# Patient Record
Sex: Female | Born: 1935 | Race: White | Hispanic: No | Smoking: Never smoker
Health system: Southern US, Community
[De-identification: ages and names within clinical notes are randomized; demographics above are authoritative.]

## PROBLEM LIST (undated history)

## (undated) DIAGNOSIS — I2510 Atherosclerotic heart disease of native coronary artery without angina pectoris: Secondary | ICD-10-CM

## (undated) DIAGNOSIS — D649 Anemia, unspecified: Secondary | ICD-10-CM

## (undated) DIAGNOSIS — R7309 Other abnormal glucose: Secondary | ICD-10-CM

## (undated) DIAGNOSIS — I6529 Occlusion and stenosis of unspecified carotid artery: Secondary | ICD-10-CM

## (undated) DIAGNOSIS — I10 Essential (primary) hypertension: Secondary | ICD-10-CM

## (undated) DIAGNOSIS — I5033 Acute on chronic diastolic (congestive) heart failure: Secondary | ICD-10-CM

## (undated) DIAGNOSIS — R0602 Shortness of breath: Secondary | ICD-10-CM

## (undated) DIAGNOSIS — C801 Malignant (primary) neoplasm, unspecified: Secondary | ICD-10-CM

## (undated) DIAGNOSIS — I219 Acute myocardial infarction, unspecified: Secondary | ICD-10-CM

## (undated) DIAGNOSIS — M1990 Unspecified osteoarthritis, unspecified site: Secondary | ICD-10-CM

## (undated) DIAGNOSIS — E785 Hyperlipidemia, unspecified: Secondary | ICD-10-CM

## (undated) DIAGNOSIS — H3530 Unspecified macular degeneration: Secondary | ICD-10-CM

## (undated) HISTORY — PX: OTHER SURGICAL HISTORY: SHX169

## (undated) HISTORY — DX: Hyperlipidemia, unspecified: E78.5

## (undated) HISTORY — PX: CATARACT EXTRACTION: SUR2

## (undated) HISTORY — DX: Anemia, unspecified: D64.9

## (undated) HISTORY — DX: Unspecified osteoarthritis, unspecified site: M19.90

## (undated) HISTORY — DX: Acute on chronic diastolic (congestive) heart failure: I50.33

## (undated) HISTORY — DX: Unspecified macular degeneration: H35.30

## (undated) HISTORY — DX: Essential (primary) hypertension: I10

## (undated) HISTORY — DX: Other abnormal glucose: R73.09

## (undated) HISTORY — PX: ADENOIDECTOMY: SUR15

## (undated) HISTORY — PX: TONSILLECTOMY: SUR1361

## (undated) HISTORY — DX: Malignant (primary) neoplasm, unspecified: C80.1

## (undated) HISTORY — PX: EYE SURGERY: SHX253

## (undated) HISTORY — DX: Occlusion and stenosis of unspecified carotid artery: I65.29

## (undated) HISTORY — DX: Atherosclerotic heart disease of native coronary artery without angina pectoris: I25.10

---

## 1999-08-17 HISTORY — PX: OTHER SURGICAL HISTORY: SHX169

## 1999-10-23 ENCOUNTER — Inpatient Hospital Stay (HOSPITAL_COMMUNITY): Admission: EM | Admit: 1999-10-23 | Discharge: 1999-10-30 | Payer: Self-pay | Admitting: Emergency Medicine

## 1999-10-23 ENCOUNTER — Encounter: Payer: Self-pay | Admitting: Emergency Medicine

## 1999-10-26 ENCOUNTER — Encounter: Payer: Self-pay | Admitting: Internal Medicine

## 2001-08-16 DIAGNOSIS — R7309 Other abnormal glucose: Secondary | ICD-10-CM

## 2001-08-16 HISTORY — DX: Other abnormal glucose: R73.09

## 2002-05-28 ENCOUNTER — Observation Stay (HOSPITAL_COMMUNITY): Admission: EM | Admit: 2002-05-28 | Discharge: 2002-05-29 | Payer: Self-pay | Admitting: Emergency Medicine

## 2002-05-28 ENCOUNTER — Encounter: Payer: Self-pay | Admitting: Emergency Medicine

## 2002-05-29 ENCOUNTER — Encounter: Payer: Self-pay | Admitting: Cardiology

## 2004-09-25 ENCOUNTER — Ambulatory Visit: Payer: Self-pay | Admitting: Cardiology

## 2004-10-21 ENCOUNTER — Ambulatory Visit: Payer: Self-pay | Admitting: Internal Medicine

## 2005-11-29 ENCOUNTER — Ambulatory Visit: Payer: Self-pay | Admitting: Cardiology

## 2005-12-14 ENCOUNTER — Ambulatory Visit: Payer: Self-pay

## 2006-12-06 ENCOUNTER — Ambulatory Visit: Payer: Self-pay

## 2006-12-06 ENCOUNTER — Ambulatory Visit: Payer: Self-pay | Admitting: Cardiology

## 2006-12-06 ENCOUNTER — Encounter: Payer: Self-pay | Admitting: Internal Medicine

## 2007-01-03 ENCOUNTER — Ambulatory Visit: Payer: Self-pay | Admitting: Internal Medicine

## 2007-01-03 ENCOUNTER — Encounter: Payer: Self-pay | Admitting: Internal Medicine

## 2007-01-06 ENCOUNTER — Encounter: Payer: Self-pay | Admitting: Internal Medicine

## 2007-01-11 ENCOUNTER — Ambulatory Visit (HOSPITAL_COMMUNITY): Admission: RE | Admit: 2007-01-11 | Discharge: 2007-01-11 | Payer: Self-pay | Admitting: Ophthalmology

## 2007-01-24 ENCOUNTER — Telehealth (INDEPENDENT_AMBULATORY_CARE_PROVIDER_SITE_OTHER): Payer: Self-pay | Admitting: *Deleted

## 2007-03-03 ENCOUNTER — Encounter: Payer: Self-pay | Admitting: Internal Medicine

## 2007-03-03 ENCOUNTER — Ambulatory Visit: Payer: Self-pay | Admitting: Internal Medicine

## 2007-03-03 DIAGNOSIS — I10 Essential (primary) hypertension: Secondary | ICD-10-CM | POA: Insufficient documentation

## 2007-03-03 DIAGNOSIS — E739 Lactose intolerance, unspecified: Secondary | ICD-10-CM | POA: Insufficient documentation

## 2007-03-03 DIAGNOSIS — I2510 Atherosclerotic heart disease of native coronary artery without angina pectoris: Secondary | ICD-10-CM | POA: Insufficient documentation

## 2007-03-03 LAB — CONVERTED CEMR LAB
Glucose, Bld: 117 mg/dL
Hemoglobin: 13.2 g/dL

## 2007-04-12 ENCOUNTER — Encounter: Payer: Self-pay | Admitting: Internal Medicine

## 2007-06-21 ENCOUNTER — Ambulatory Visit: Payer: Self-pay

## 2007-11-30 ENCOUNTER — Ambulatory Visit: Payer: Self-pay | Admitting: Cardiovascular Disease

## 2007-12-28 ENCOUNTER — Telehealth (INDEPENDENT_AMBULATORY_CARE_PROVIDER_SITE_OTHER): Payer: Self-pay | Admitting: *Deleted

## 2007-12-29 ENCOUNTER — Ambulatory Visit: Payer: Self-pay | Admitting: Internal Medicine

## 2008-01-15 ENCOUNTER — Ambulatory Visit: Payer: Self-pay | Admitting: Internal Medicine

## 2008-01-15 LAB — CONVERTED CEMR LAB
OCCULT 1: POSITIVE
OCCULT 2: NEGATIVE
OCCULT 3: NEGATIVE

## 2008-01-16 ENCOUNTER — Encounter: Payer: Self-pay | Admitting: Internal Medicine

## 2008-01-17 ENCOUNTER — Encounter (INDEPENDENT_AMBULATORY_CARE_PROVIDER_SITE_OTHER): Payer: Self-pay | Admitting: *Deleted

## 2008-02-07 ENCOUNTER — Ambulatory Visit: Payer: Self-pay | Admitting: Gastroenterology

## 2008-02-09 ENCOUNTER — Telehealth (INDEPENDENT_AMBULATORY_CARE_PROVIDER_SITE_OTHER): Payer: Self-pay | Admitting: *Deleted

## 2008-02-12 ENCOUNTER — Ambulatory Visit: Payer: Self-pay | Admitting: Internal Medicine

## 2008-02-15 ENCOUNTER — Encounter (INDEPENDENT_AMBULATORY_CARE_PROVIDER_SITE_OTHER): Payer: Self-pay | Admitting: *Deleted

## 2008-02-15 LAB — CONVERTED CEMR LAB
BUN: 11 mg/dL (ref 6–23)
Creatinine, Ser: 0.8 mg/dL (ref 0.4–1.2)
Potassium: 4.5 meq/L (ref 3.5–5.1)

## 2008-07-10 ENCOUNTER — Ambulatory Visit: Payer: Self-pay

## 2008-10-03 ENCOUNTER — Ambulatory Visit: Payer: Self-pay | Admitting: Internal Medicine

## 2008-10-03 LAB — CONVERTED CEMR LAB
ALT: 19 units/L (ref 0–35)
Albumin: 3.9 g/dL (ref 3.5–5.2)
Amylase: 104 units/L (ref 27–131)
Basophils Absolute: 0 10*3/uL (ref 0.0–0.1)
Basophils Relative: 0.2 % (ref 0.0–3.0)
Bilirubin, Direct: 0.1 mg/dL (ref 0.0–0.3)
Eosinophils Relative: 0.8 % (ref 0.0–5.0)
Lipase: 36 units/L (ref 11.0–59.0)
Lymphocytes Relative: 17.4 % (ref 12.0–46.0)
MCV: 89.5 fL (ref 78.0–100.0)
Neutrophils Relative %: 72.6 % (ref 43.0–77.0)
RDW: 12.4 % (ref 11.5–14.6)

## 2008-10-04 ENCOUNTER — Encounter (INDEPENDENT_AMBULATORY_CARE_PROVIDER_SITE_OTHER): Payer: Self-pay | Admitting: *Deleted

## 2008-10-09 ENCOUNTER — Ambulatory Visit: Payer: Self-pay | Admitting: Internal Medicine

## 2008-10-09 LAB — CONVERTED CEMR LAB
OCCULT 1: NEGATIVE
OCCULT 2: NEGATIVE
OCCULT 3: NEGATIVE

## 2008-10-10 ENCOUNTER — Encounter (INDEPENDENT_AMBULATORY_CARE_PROVIDER_SITE_OTHER): Payer: Self-pay | Admitting: *Deleted

## 2008-11-12 DIAGNOSIS — Z8669 Personal history of other diseases of the nervous system and sense organs: Secondary | ICD-10-CM | POA: Insufficient documentation

## 2008-11-12 DIAGNOSIS — M1990 Unspecified osteoarthritis, unspecified site: Secondary | ICD-10-CM | POA: Insufficient documentation

## 2008-11-12 DIAGNOSIS — I6529 Occlusion and stenosis of unspecified carotid artery: Secondary | ICD-10-CM

## 2008-11-12 DIAGNOSIS — H9190 Unspecified hearing loss, unspecified ear: Secondary | ICD-10-CM | POA: Insufficient documentation

## 2008-11-12 DIAGNOSIS — E785 Hyperlipidemia, unspecified: Secondary | ICD-10-CM

## 2008-11-12 DIAGNOSIS — Z87898 Personal history of other specified conditions: Secondary | ICD-10-CM

## 2008-11-12 DIAGNOSIS — C4490 Unspecified malignant neoplasm of skin, unspecified: Secondary | ICD-10-CM

## 2008-11-19 ENCOUNTER — Ambulatory Visit: Payer: Self-pay | Admitting: Cardiovascular Disease

## 2008-11-19 ENCOUNTER — Encounter: Payer: Self-pay | Admitting: Cardiovascular Disease

## 2009-07-16 ENCOUNTER — Encounter: Payer: Self-pay | Admitting: Cardiovascular Disease

## 2009-07-17 ENCOUNTER — Ambulatory Visit: Payer: Self-pay

## 2009-07-17 ENCOUNTER — Encounter: Payer: Self-pay | Admitting: Cardiology

## 2009-07-17 ENCOUNTER — Telehealth (INDEPENDENT_AMBULATORY_CARE_PROVIDER_SITE_OTHER): Payer: Self-pay | Admitting: *Deleted

## 2009-07-18 ENCOUNTER — Encounter: Payer: Self-pay | Admitting: Cardiovascular Disease

## 2009-11-18 ENCOUNTER — Ambulatory Visit: Payer: Self-pay | Admitting: Cardiovascular Disease

## 2009-11-21 LAB — CONVERTED CEMR LAB
BUN: 15 mg/dL (ref 6–23)
GFR calc non Af Amer: 62.03 mL/min (ref >60)
Glucose, Bld: 88 mg/dL (ref 70–99)

## 2009-12-01 ENCOUNTER — Telehealth: Payer: Self-pay | Admitting: Cardiovascular Disease

## 2009-12-26 ENCOUNTER — Ambulatory Visit: Payer: Self-pay | Admitting: Cardiology

## 2009-12-26 ENCOUNTER — Inpatient Hospital Stay (HOSPITAL_COMMUNITY): Admission: EM | Admit: 2009-12-26 | Discharge: 2009-12-30 | Payer: Self-pay | Admitting: Emergency Medicine

## 2009-12-27 ENCOUNTER — Encounter (INDEPENDENT_AMBULATORY_CARE_PROVIDER_SITE_OTHER): Payer: Self-pay | Admitting: Family Medicine

## 2009-12-29 ENCOUNTER — Ambulatory Visit: Payer: Self-pay | Admitting: Vascular Surgery

## 2009-12-29 ENCOUNTER — Encounter (INDEPENDENT_AMBULATORY_CARE_PROVIDER_SITE_OTHER): Payer: Self-pay | Admitting: Internal Medicine

## 2009-12-30 ENCOUNTER — Encounter (INDEPENDENT_AMBULATORY_CARE_PROVIDER_SITE_OTHER): Payer: Self-pay | Admitting: Internal Medicine

## 2009-12-31 ENCOUNTER — Encounter: Payer: Self-pay | Admitting: Internal Medicine

## 2010-01-08 ENCOUNTER — Telehealth: Payer: Self-pay | Admitting: Cardiovascular Disease

## 2010-01-16 ENCOUNTER — Ambulatory Visit: Payer: Self-pay | Admitting: Cardiovascular Disease

## 2010-02-24 ENCOUNTER — Telehealth (INDEPENDENT_AMBULATORY_CARE_PROVIDER_SITE_OTHER): Payer: Self-pay | Admitting: *Deleted

## 2010-08-27 ENCOUNTER — Ambulatory Visit
Admission: RE | Admit: 2010-08-27 | Discharge: 2010-08-27 | Payer: Self-pay | Source: Home / Self Care | Attending: Internal Medicine | Admitting: Internal Medicine

## 2010-08-27 DIAGNOSIS — R05 Cough: Secondary | ICD-10-CM | POA: Insufficient documentation

## 2010-08-28 ENCOUNTER — Ambulatory Visit
Admission: RE | Admit: 2010-08-28 | Discharge: 2010-08-28 | Payer: Self-pay | Source: Home / Self Care | Attending: Internal Medicine | Admitting: Internal Medicine

## 2010-08-28 ENCOUNTER — Other Ambulatory Visit: Payer: Self-pay | Admitting: Internal Medicine

## 2010-08-28 LAB — CBC WITH DIFFERENTIAL/PLATELET
Basophils Absolute: 0.1 10*3/uL (ref 0.0–0.1)
Basophils Relative: 0.6 % (ref 0.0–3.0)
Eosinophils Absolute: 0.1 10*3/uL (ref 0.0–0.7)
Eosinophils Relative: 0.9 % (ref 0.0–5.0)
HCT: 36.9 % (ref 36.0–46.0)
Hemoglobin: 12.4 g/dL (ref 12.0–15.0)
Lymphocytes Relative: 18.5 % (ref 12.0–46.0)
Lymphs Abs: 1.7 10*3/uL (ref 0.7–4.0)
MCHC: 33.7 g/dL (ref 30.0–36.0)
MCV: 85.8 fl (ref 78.0–100.0)
Monocytes Absolute: 0.8 10*3/uL (ref 0.1–1.0)
Monocytes Relative: 9.1 % (ref 3.0–12.0)
Neutro Abs: 6.4 10*3/uL (ref 1.4–7.7)
Neutrophils Relative %: 70.9 % (ref 43.0–77.0)
Platelets: 369 10*3/uL (ref 150.0–400.0)
RBC: 4.3 Mil/uL (ref 3.87–5.11)
RDW: 14.1 % (ref 11.5–14.6)
WBC: 9.1 10*3/uL (ref 4.5–10.5)

## 2010-08-28 LAB — BASIC METABOLIC PANEL
BUN: 21 mg/dL (ref 6–23)
CO2: 30 mEq/L (ref 19–32)
Calcium: 9.4 mg/dL (ref 8.4–10.5)
Chloride: 97 mEq/L (ref 96–112)
Creatinine, Ser: 1.1 mg/dL (ref 0.4–1.2)
GFR: 48.62 mL/min — ABNORMAL LOW (ref >60.00)
Glucose, Bld: 106 mg/dL — ABNORMAL HIGH (ref 70–99)
Potassium: 4.7 mEq/L (ref 3.5–5.1)
Sodium: 135 mEq/L (ref 135–145)

## 2010-08-28 LAB — HEPATIC FUNCTION PANEL
ALT: 15 U/L (ref 0–35)
AST: 20 U/L (ref 0–37)
Albumin: 3.8 g/dL (ref 3.5–5.2)
Alkaline Phosphatase: 75 U/L (ref 39–117)
Bilirubin, Direct: 0.1 mg/dL (ref 0.0–0.3)
Total Bilirubin: 0.7 mg/dL (ref 0.3–1.2)
Total Protein: 7.3 g/dL (ref 6.0–8.3)

## 2010-08-28 LAB — LIPID PANEL
Cholesterol: 201 mg/dL — ABNORMAL HIGH (ref 0–200)
HDL: 48.1 mg/dL (ref >39.00)
Total CHOL/HDL Ratio: 4
Triglycerides: 140 mg/dL (ref 0.0–149.0)
VLDL: 28 mg/dL (ref 0.0–40.0)

## 2010-08-28 LAB — TSH: TSH: 4.57 u[IU]/mL (ref 0.35–5.50)

## 2010-08-28 LAB — LDL CHOLESTEROL, DIRECT: Direct LDL: 133.5 mg/dL

## 2010-08-31 LAB — HEMOGLOBIN A1C: Hgb A1c MFr Bld: 6.7 % — ABNORMAL HIGH (ref 4.6–6.5)

## 2010-09-17 NOTE — Letter (Signed)
Summary: Encounter Notice/[facility]  Encounter Notice/[facility]   Imported By: Lanelle Bal 01/07/2010 09:03:08  _____________________________________________________________________  External Attachment:    Type:   Image     Comment:   External Document

## 2010-09-17 NOTE — Progress Notes (Signed)
Summary: Question about Plavix  Phone Note Other Incoming   Caller: [facility]/Melissa [phone] Summary of Call: Nurse calling regarding the pt Plavix have questions about pt taking this medication Initial call taken by: Judie Grieve,  Jan 08, 2010 9:32 AM  Follow-up for Phone Call        Resident health care coordinator at [facility]. Left message for Melissa to call back. Julieta Gutting, RN, BSN  Jan 08, 2010 10:08 AM  I spoke with Efraim Kaufmann and the pt was recently hospitalized for TIA symptoms.  The pt was not under our service.  Melissa noticed that Plavix was on the pt's discharge summary but the pt is not taking this medication.  The pt does list Plavix as an allergy due to stomach upset.  I made Melissa aware that the pt was refusing to take Plavix per her discharge summary.  The pt is currently taking ASA as instructed.  I told Melissa that the pt has the right to not take Plavix if that is her decision.  Follow-up by: Julieta Gutting, RN, BSN,  Jan 08, 2010 10:21 AM

## 2010-09-17 NOTE — Assessment & Plan Note (Signed)
Summary: roa per pt//lch   Vital Signs:  Patient profile:   77 year old female Height:      62.5 inches Weight:      123.8 pounds BMI:     22.36 Temp:     98.4 degrees F oral Pulse rate:   72 / minute Resp:     16 per minute BP sitting:   118 / 60  (left arm) Cuff size:   regular  Vitals Entered By: Shonna Chock CMA (August 27, 2010 9:30 AM) CC: Patient not seen in a while and would like to follow-up with Dr.Jarrell Armond , Cough   Primary Care Provider:  Marga Melnick, MD  CC:  Patient not seen in a while and would like to follow-up with Dr.Lindsey Hommel  and Cough.  History of Present Illness: Hypertension Follow-Up      This is a 77 year old woman who presents for Hypertension follow-up.  The patient reports urinary frequency ( "but I drink up to 40 oz of water / day"), but denies lightheadedness, headaches, edema, and fatigue.  The patient denies the following associated symptoms: exertional chest pain, chest pressure, dyspnea, palpitations, and syncope.  Compliance with medications (by patient report) has been near 100%.  The patient reports no exercise.  Adjunctive measures currently used by the patient include salt restriction. Dr Nash Mantis OV post hospitalization for TIA in context of L carotid stenosis  reviewed  Cough      The patient also presents with Cough , "? ever since I've been taking Diovan".  The patient reports non-productive cough, but denies pleuritic chest pain, wheezing, fever, and hemoptysis.  Associated symtpoms include chronic rhinitis. No PMH of asthma; she never smoked. The patient denies the following symptoms: cold/URI symptoms, nasal congestion, and acid reflux symptoms.  Ineffective prior treatments have included OTC cough medication.    Current Medications (verified): 1)  Diovan Hct 160-12.5 Mg Tabs (Valsartan-Hydrochlorothiazide) .... Take One Tablet Daily 2)  Lumigan 0.03 %  Soln (Bimatoprost) 3)  Nitrostat 0.4 Mg  Subl (Nitroglycerin) .... One Under The Tongue  Every Five Minutes For Three Times If Chest Pain 4)  Metoprolol Tartrate 50 Mg Tabs (Metoprolol Tartrate) .... Take 1/2 Tablet Two Times A Day 5)  Centrum Silver  Tabs (Multiple Vitamins-Minerals) .... Take 1 Tablet By Mouth Once A Day 6)  Fish Oil 1000 Mg Caps (Omega-3 Fatty Acids) .... Take 1 Capsule By Mouth Once A Day 7)  Vitamin C 500 Mg Chew (Ascorbic Acid) .... Once Daily 8)  Hydralazine Hcl 25 Mg Tabs (Hydralazine Hcl) .... Take 1 Tablet By Mouth Three Times A Day 9)  Aspirin 81 Mg Tbec (Aspirin) .... Take One Tablet By Mouth Daily 10)  Azopt 1 % Susp (Brinzolamide) .... Two Times A Day  Allergies: 1)  ! * Altace 2)  ! Lipitor (Atorvastatin) 3)  ! Plavix (Clopidogrel Bisulfate)  Review of Systems Allergy:  Complains of itching eyes and sneezing.  Physical Exam  General:  Thin,in no acute distress; alert,appropriate and cooperative throughout examination Eyes:  Ptosis OD Ears:  External ear exam shows no significant lesions or deformities.  Otoscopic examination reveals clear canals, tympanic membranes are intact bilaterally without bulging, retraction, inflammation or discharge. Hearing is grossly decreased (she is to have hearing aid checked) Nose:  External nasal examination shows no deformity or inflammation. Nasal mucosa are pink and moist without lesions or exudates. Mouth:  Oral mucosa and oropharynx without lesions or exudates.  Teeth in good repair. Lungs:  Normal respiratory effort, chest expands symmetrically. Lungs are clear to auscultation, no crackles or wheezes. Decreased BS Heart:  Normal rate and regular rhythm. S1 and S2 normal without gallop,  click, rub . Faint systolic murmur Pulses:  R and L carotid,radial,dorsalis pedis and posterior tibial pulses are full and equal bilaterally. Carotid bruits bilaterally Extremities:  No clubbing, cyanosis, edema. Neurologic:  alert & oriented X3.   Nasolabial asymmetry, decreased on L Skin:  Intact without suspicious  lesions or rashes Cervical Nodes:  No lymphadenopathy noted Axillary Nodes:  No palpable lymphadenopathy Psych:  memory intact for recent and remote, normally interactive, and good eye contact.     Impression & Recommendations:  Problem # 1:  COUGH (ICD-786.2)  Orders: Prescription Created Electronically [phone]) T-2 View CXR (71020TC)  Problem # 2:  HYPERTENSION (ICD-401.9) controlled Her updated medication list for this problem includes:    Diovan Hct 160-12.5 Mg Tabs (Valsartan-hydrochlorothiazide) .Marland Kitchen... Take one tablet daily    Metoprolol Tartrate 50 Mg Tabs (Metoprolol tartrate) .Marland Kitchen... Take 1/2 tablet two times a day    Hydralazine Hcl 25 Mg Tabs (Hydralazine hcl) .Marland Kitchen... Take 1 tablet by mouth three times a day  Problem # 3:  DYSLIPIDEMIA (ICD-272.4)  Problem # 4:  CAROTID STENOSIS (ICD-433.10)  Her updated medication list for this problem includes:    Aspirin 81 Mg Tbec (Aspirin) .Marland Kitchen... Take one tablet by mouth daily  Problem # 5:  CORONARY ARTERY DISEASE (ICD-414.00)  Her updated medication list for this problem includes:    Diovan Hct 160-12.5 Mg Tabs (Valsartan-hydrochlorothiazide) .Marland Kitchen... Take one tablet daily    Nitrostat 0.4 Mg Subl (Nitroglycerin) ..... One under the tongue every five minutes for three times if chest pain    Metoprolol Tartrate 50 Mg Tabs (Metoprolol tartrate) .Marland Kitchen... Take 1/2 tablet two times a day    Hydralazine Hcl 25 Mg Tabs (Hydralazine hcl) .Marland Kitchen... Take 1 tablet by mouth three times a day    Aspirin 81 Mg Tbec (Aspirin) .Marland Kitchen... Take one tablet by mouth daily  Orders: Prescription Created Electronically [phone])  Complete Medication List: 1)  Diovan Hct 160-12.5 Mg Tabs (Valsartan-hydrochlorothiazide) .... Take one tablet daily 2)  Lumigan 0.03 % Soln (Bimatoprost) 3)  Nitrostat 0.4 Mg Subl (Nitroglycerin) .... One under the tongue every five minutes for three times if chest pain 4)  Metoprolol Tartrate 50 Mg Tabs (Metoprolol tartrate) .... Take 1/2  tablet two times a day 5)  Centrum Silver Tabs (Multiple vitamins-minerals) .... Take 1 tablet by mouth once a day 6)  Fish Oil 1000 Mg Caps (Omega-3 fatty acids) .... Take 1 capsule by mouth once a day 7)  Vitamin C 500 Mg Chew (Ascorbic acid) .... Once daily 8)  Hydralazine Hcl 25 Mg Tabs (Hydralazine hcl) .... Take 1 tablet by mouth three times a day 9)  Aspirin 81 Mg Tbec (Aspirin) .... Take one tablet by mouth daily 10)  Azopt 1 % Susp (Brinzolamide) .... Two times a day 11)  Loratadine 10 Mg Tabs (Loratadine) .Marland Kitchen.. 1 once daily for cough or allergic symptoms  Patient Instructions: 1)  Fasting blood work @ [facility] the same day Chest Xray is done: 2)  BMP prior to visit, ICD-9:401.9 3)  Hepatic Panel prior to visit, ICD-9:995.20 4)  Lipid Panel prior to visit, ICD-9:272.4 5)  TSH prior to visit, ICD-9:272.4 6)  CBC w/ Diff prior to visit, ICD-9:786.2. Prescriptions: LORATADINE 10 MG TABS (LORATADINE) 1 once daily for cough or allergic symptoms  #30  x 5   Entered and Authorized by:   Marga Melnick MD   Signed by:   Marga Melnick MD on 08/27/2010   Method used:   Electronically to        [id number] (retail)       [address]       [place], [place]  [address]       Ph: [phone] or [phone]       Fax: [phone]   RxID:   [id number] NITROSTAT 0.4 MG  SUBL (NITROGLYCERIN) one under the tongue every five minutes for three times if chest pain  #25 x prn   Entered and Authorized by:   Marga Melnick MD   Signed by:   Marga Melnick MD on 08/27/2010   Method used:   Electronically to        [id number] (retail)       [address]       [place], [place]  [address]       Ph: [phone] or [phone]       Fax: [phone]   RxID:   [id number]    Orders Added: 1)  Prescription Created Electronically [G8553] 2)  Est. Patient Level IV [62376] 3)  T-2 View CXR [71020TC]

## 2010-09-17 NOTE — Progress Notes (Signed)
Summary: BP follow-up  Phone Note Outgoing Call   Call placed by: Julieta Gutting, RN, BSN,  December 01, 2009 3:39 PM Call placed to: Patient Summary of Call: I called the pt to follow-up on her BP.  The pt said she had the nurse check her BP last week and it was 190/80.  I instructed the pt to increase her Diovan HCT to one tablet daily.  The pt said she thinks when she did that in the past her BP was to low.  The pt will try this for the next few days and have the nurse at [facility] check her BP.  The pt will call back in 1 week and let me know how her BP is running.  If she is tolerating the higher dose of Diovan HCT a new Rx will be sent to the pharmacy. The pt agrees with plan.  Initial call taken by: Julieta Gutting, RN, BSN,  December 01, 2009 3:41 PM    New/Updated Medications: DIOVAN HCT 160-12.5 MG TABS (VALSARTAN-HYDROCHLOROTHIAZIDE) take one tablet daily

## 2010-09-17 NOTE — Assessment & Plan Note (Signed)
Summary: eph/dx: 414.01/ tia/lg   Visit Type:  Post-hospital Primary Provider:  Marga Melnick, MD  CC:  fatigue.  History of Present Illness: 77 year-old woman with hx of CAD and extranial cerebrovascular disease presenting for hospital follow-up evaluation. She recently was hopitalized with TIA symptoms and was found to have severe left ICA stenosis. She declined revascularization because of her advanced age. She has previously been intolerant to ASA and plavix, but is tolerating ASA since hospital discharge. She has not taken plavix as prescribed because of prior intolerance. She denies CP, dyspnea, or edema. No further speech, visual, or focal neurologic complaints.  Current Medications (verified): 1)  Diovan Hct 160-12.5 Mg Tabs (Valsartan-Hydrochlorothiazide) .... Take One Tablet Daily 2)  Lumigan 0.03 %  Soln (Bimatoprost) 3)  Nitrostat 0.4 Mg  Subl (Nitroglycerin) .... One Under The Tongue Every Five Minutes For Three Times If Chest Pain 4)  Metoprolol Tartrate 50 Mg Tabs (Metoprolol Tartrate) .... Take 1/2 Tablet Two Times A Day 5)  Centrum Silver  Tabs (Multiple Vitamins-Minerals) .... Take 1 Tablet By Mouth Once A Day 6)  Fish Oil 1000 Mg Caps (Omega-3 Fatty Acids) .... Take 1 Capsule By Mouth Once A Day 7)  Vitamin C 500 Mg Chew (Ascorbic Acid) .... Once Daily 8)  Hydralazine Hcl 25 Mg Tabs (Hydralazine Hcl) .... Take 1 Tablet By Mouth Three Times A Day 9)  Aspirin 81 Mg Tbec (Aspirin) .... Take One Tablet By Mouth Daily  Allergies: 1)  ! * Altace 2)  ! Lipitor (Atorvastatin) 3)  ! Plavix (Clopidogrel Bisulfate)  Past History:  Past medical history reviewed for relevance to current acute and chronic problems.  Past Medical History: OSTEOARTHRITIS (ICD-715.90) DYSLIPIDEMIA (ICD-272.4) CAROTID STENOSIS (ICD-433.10) with TIA 2011 HYPERTENSION (ICD-401.9) CORONARY ARTERY DISEASE (ICD-414.00) GLUCOSE INTOLERANCE (ICD-271.3) GLAUCOMA, HX OF (ICD-V1 HEARING LOSS  (ICD-389.9) CARCINOMA, BASAL CELL (ICD-173.9) CATARACT, HX OF (ICD-V12.40)  Review of Systems       Negative except as per HPI   Vital Signs:  Patient profile:   77 year old female Height:      63 inches Weight:      127.75 pounds BMI:     22.71 Pulse rate:   59 / minute Pulse rhythm:   regular Resp:     18 per minute BP sitting:   120 / 56  (left arm) Cuff size:   small  Vitals Entered By: Vikki Ports (January 16, 2010 1:53 PM)  Physical Exam  General:  Pt is alert and oriented, elderly woman, in no acute distress. HEENT: normal Neck: normal carotid upstrokes with bilateral bruits - harsh on the right, JVP normal Lungs: CTA CV: RRR without murmur or gallop Abd: soft, NT, positive BS, no bruit, no organomegaly Ext: no clubbing, cyanosis, or edema. peripheral pulses 2+ and equal Skin: warm and dry without rash    EKG  Procedure date:  01/16/2010  Findings:      Sinus bradycardia, LBBB, HR 59 bpm.  Impression & Recommendations:  Problem # 1:  CAROTID STENOSIS (ICD-433.10) Forunately she has not had further ischemic symptoms and she is tolerating ASA. Continue current Rx. Need to try to avoid overtreatment of her blood pressure in the setting of severe carotid disease.  Her updated medication list for this problem includes:    Aspirin 81 Mg Tbec (Aspirin) .Marland Kitchen... Take one tablet by mouth daily  Problem # 2:  CAD, UNSPECIFIED SITE (ICD-414.00) Stable without angina on ASA and beta blocker.  Her updated medication  list for this problem includes:    Nitrostat 0.4 Mg Subl (Nitroglycerin) ..... One under the tongue every five minutes for three times if chest pain    Metoprolol Tartrate 50 Mg Tabs (Metoprolol tartrate) .Marland Kitchen... Take 1/2 tablet two times a day    Aspirin 81 Mg Tbec (Aspirin) .Marland Kitchen... Take one tablet by mouth daily  Orders: EKG w/ Interpretation (93000)  Patient Instructions: 1)  Your physician recommends that you schedule a follow-up appointment in: 6  months with Dr. Excell Seltzer 2)  Your physician recommends that you continue on your current medications as directed. Please refer to the Current Medication list given to you today.

## 2010-09-17 NOTE — Progress Notes (Signed)
Summary: refill request  Phone Note Refill Request Message from:  Patient on February 24, 2010 2:00 PM  Refills Requested: Medication #1:  HYDRALAZINE HCL 25 MG TABS Take 1 tablet by mouth three times a day  Medication #2:  DIOVAN HCT 160-12.5 MG TABS take one tablet daily pt was on 1/2 tab of diovan, now taking 1 tab, needs rx changed to the 1 tab   Method Requested: Telephone to Pharmacy Initial call taken by: Glynda Jaeger,  February 24, 2010 2:00 PM  Follow-up for Phone Call        Rx faxed to pharmacy Follow-up by: Vikki Ports,  February 24, 2010 4:56 PM    Prescriptions: HYDRALAZINE HCL 25 MG TABS (HYDRALAZINE HCL) Take 1 tablet by mouth three times a day  #270 x 3   Entered by:   Vikki Ports   Authorized by:   Norva Karvonen, MD   Signed by:   Vikki Ports on 02/24/2010   Method used:   Faxed to ...       [facility]             , [place]         Ph:        Fax: [phone]   RxID:   [id number] DIOVAN HCT 160-12.5 MG TABS (VALSARTAN-HYDROCHLOROTHIAZIDE) take one tablet daily  #90 x 3   Entered by:   Vikki Ports   Authorized by:   Norva Karvonen, MD   Signed by:   Vikki Ports on 02/24/2010   Method used:   Faxed to ...       [facility] (mail-order)             , [place]         Ph:        Fax: [phone]   RxID:   [id number]

## 2010-09-17 NOTE — Assessment & Plan Note (Signed)
Summary: f1y    Visit Type:  1 year follow up Primary Provider:  Marga Melnick, MD  CC:  Chest pains sometimes.  History of Present Illness: This is a 77 year old woman with remote history of coronary artery disease, presenting today for followup evaluation. She has been stable from a cardiac perspective since her visit last year. She admits to taking only 3-4 nitroglycerin over the past year. She denies exertional chest pain or dyspnea. She is inactive. She has occasional pressure-like pain on the left side but these have been fleeting in nature.  Current Medications (verified): 1)  Diovan Hct 160-12.5 Mg  Tabs (Valsartan-Hydrochlorothiazide) .... 1/2 By Mouth Once Daily 2)  Lumigan 0.03 %  Soln (Bimatoprost) 3)  Nitrostat 0.4 Mg  Subl (Nitroglycerin) .... One Under The Tongue Every Five Minutes For Three Times If Chest Pain 4)  Metoprolol Tartrate 50 Mg Tabs (Metoprolol Tartrate) .... Take 1/2 Tablet Two Times A Day 5)  Centrum Silver  Tabs (Multiple Vitamins-Minerals) .... Take 1 Tablet By Mouth Once A Day 6)  Fish Oil 1000 Mg Caps (Omega-3 Fatty Acids) .... Take 1 Capsule By Mouth Once A Day 7)  Vitamin C 500 Mg Chew (Ascorbic Acid) .... Once Daily  Allergies: 1)  ! * Altace 2)  ! Lipitor (Atorvastatin) 3)  ! Plavix (Clopidogrel Bisulfate)  Past History:  Past medical history reviewed for relevance to current acute and chronic problems.  Past Medical History: Reviewed history from 11/12/2008 and no changes required. OSTEOARTHRITIS (ICD-715.90) DYSLIPIDEMIA (ICD-272.4) CAROTID STENOSIS (ICD-433.10) HYPERTENSION (ICD-401.9) CORONARY ARTERY DISEASE (ICD-414.00) GLUCOSE INTOLERANCE (ICD-271.3) GLAUCOMA, HX OF (ICD-V1 HEARING LOSS (ICD-389.9) CARCINOMA, BASAL CELL (ICD-173.9) CATARACT, HX OF (ICD-V12.40)  Review of Systems       Negative except as per HPI   Vital Signs:  Patient profile:   77 year old female Height:      63 inches Weight:      130.50 pounds Pulse  rate:   51 / minute Pulse rhythm:   irregular Resp:     18 per minute BP sitting:   170 / 60  (left arm) Cuff size:   regular  Vitals Entered By: Vikki Ports (November 18, 2009 10:42 AM)  Physical Exam  General:  Pt is alert and oriented, elderly woman, in no acute distress. HEENT: normal Neck: normal carotid upstrokes with bilateral bruits left louder than right, JVP normal Lungs: CTA CV: RRR with a harsh 2/6 systolic ejection murmur at the right upper sternal border with preserved aortic closure sound Abd: soft, NT, positive BS, no bruit, no organomegaly Ext: no clubbing, cyanosis, or edema. peripheral pulses 2+ and equal Skin: warm and dry without rash    EKG  Procedure date:  11/18/2009  Findings:      Sinus bradycardia, left bundle branch block.  Impression & Recommendations:  Problem # 1:  CAD, UNSPECIFIED SITE (ICD-414.00) Stable without angina. The patient requests to minimize her followup as it is difficult for her to get out. Since she sees Dr. Alwyn Ren as her primary physician, I advised that she could follow up here as needed. Her updated medication list for this problem includes:    Nitrostat 0.4 Mg Subl (Nitroglycerin) ..... One under the tongue every five minutes for three times if chest pain    Metoprolol Tartrate 50 Mg Tabs (Metoprolol tartrate) .Marland Kitchen... Take 1/2 tablet two times a day  Orders: EKG w/ Interpretation (93000) TLB-BMP (Basic Metabolic Panel-BMET) (80048-METABOL)  Problem # 2:  CAROTID STENOSIS (ICD-433.10) No  neurologic symptoms. Will defer on imaging since I wouldn't refer her for revascularization in the setting of her advanced age.  Problem # 3:  HYPERTENSION (ICD-401.9) Systolic pressure elevated. We'll check a metabolic panel before titrating her medications. Her updated medication list for this problem includes:    Diovan Hct 160-12.5 Mg Tabs (Valsartan-hydrochlorothiazide) .Marland Kitchen... 1/2 by mouth once daily    Metoprolol Tartrate 50 Mg Tabs  (Metoprolol tartrate) .Marland Kitchen... Take 1/2 tablet two times a day  Orders: EKG w/ Interpretation (93000) TLB-BMP (Basic Metabolic Panel-BMET) (80048-METABOL)  BP today: 170/60 Prior BP: 143/68 (11/19/2008)  Prior 10 Yr Risk Heart Disease: N/A (02/12/2008)  Labs Reviewed: K+: 4.5 (02/12/2008) Creat: : 0.8 (02/12/2008)     Patient Instructions: 1)  Your physician recommends that you schedule a follow-up appointment as needed.  2)  Your physician recommends that you continue on your current medications as directed. Please refer to the Current Medication list given to you today. 3)  Your physician recommends that you have lab work today: BMP 4)  Your physician has requested that you regularly monitor and record your blood pressure readings at [facility].  Please have the nurse check your blood pressure a couple of times over the next 2 weeks and call our office with those readings [phone].   Appended Document: f1y Reviewed metabolic panel. Creatinine is normal. Recommend increasing the Diovan HCT to one full pill daily.

## 2010-11-02 LAB — BASIC METABOLIC PANEL
CO2: 27 mEq/L (ref 19–32)
Chloride: 102 mEq/L (ref 96–112)
Creatinine, Ser: 0.76 mg/dL (ref 0.4–1.2)
GFR calc Af Amer: >60 mL/min (ref >60)
Sodium: 134 mEq/L — ABNORMAL LOW (ref 135–145)

## 2010-11-02 LAB — CBC
Hemoglobin: 11.5 g/dL — ABNORMAL LOW (ref 12.0–15.0)
MCHC: 34 g/dL (ref 30.0–36.0)
WBC: 8.3 10*3/uL (ref 4.0–10.5)

## 2010-11-03 LAB — LIPID PANEL
HDL: 39 mg/dL — ABNORMAL LOW (ref >39)
LDL Cholesterol: 119 mg/dL — ABNORMAL HIGH (ref 0–99)
Triglycerides: 137 mg/dL (ref <150)

## 2010-11-03 LAB — CARDIAC PANEL(CRET KIN+CKTOT+MB+TROPI)
CK, MB: 2.3 ng/mL (ref 0.3–4.0)
CK, MB: 2.3 ng/mL (ref 0.3–4.0)
Relative Index: INVALID (ref 0.0–2.5)
Relative Index: INVALID (ref 0.0–2.5)
Relative Index: INVALID (ref 0.0–2.5)
Total CK: 59 U/L (ref 7–177)
Total CK: 61 U/L (ref 7–177)
Total CK: 77 U/L (ref 7–177)

## 2010-11-03 LAB — URINALYSIS, ROUTINE W REFLEX MICROSCOPIC
Hgb urine dipstick: NEGATIVE
Ketones, ur: NEGATIVE mg/dL
Nitrite: NEGATIVE
Protein, ur: NEGATIVE mg/dL
Urobilinogen, UA: 0.2 mg/dL (ref 0.0–1.0)

## 2010-11-03 LAB — DIFFERENTIAL
Basophils Absolute: 0 10*3/uL (ref 0.0–0.1)
Eosinophils Relative: 1 % (ref 0–5)
Lymphs Abs: 1.2 10*3/uL (ref 0.7–4.0)
Monocytes Absolute: 0.8 10*3/uL (ref 0.1–1.0)
Monocytes Relative: 10 % (ref 3–12)

## 2010-11-03 LAB — BASIC METABOLIC PANEL
BUN: 11 mg/dL (ref 6–23)
CO2: 28 mEq/L (ref 19–32)
Chloride: 98 mEq/L (ref 96–112)
Creatinine, Ser: 0.71 mg/dL (ref 0.4–1.2)
Creatinine, Ser: 0.82 mg/dL (ref 0.4–1.2)
Glucose, Bld: 127 mg/dL — ABNORMAL HIGH (ref 70–99)
Potassium: 4 mEq/L (ref 3.5–5.1)
Sodium: 136 mEq/L (ref 135–145)

## 2010-11-03 LAB — CBC
HCT: 33.5 % — ABNORMAL LOW (ref 36.0–46.0)
Hemoglobin: 11.6 g/dL — ABNORMAL LOW (ref 12.0–15.0)
MCHC: 34.1 g/dL (ref 30.0–36.0)
MCHC: 34.8 g/dL (ref 30.0–36.0)
MCV: 85.1 fL (ref 78.0–100.0)
Platelets: 305 10*3/uL (ref 150–400)
Platelets: 321 10*3/uL (ref 150–400)
RBC: 3.93 MIL/uL (ref 3.87–5.11)
RDW: 13.2 % (ref 11.5–15.5)
WBC: 9.7 10*3/uL (ref 4.0–10.5)

## 2010-11-03 LAB — COMPREHENSIVE METABOLIC PANEL
Albumin: 3.5 g/dL (ref 3.5–5.2)
BUN: 14 mg/dL (ref 6–23)
Creatinine, Ser: 0.7 mg/dL (ref 0.4–1.2)
GFR calc Af Amer: >60 mL/min (ref >60)
GFR calc non Af Amer: >60 mL/min (ref >60)
Glucose, Bld: 117 mg/dL — ABNORMAL HIGH (ref 70–99)
Sodium: 135 mEq/L (ref 135–145)
Total Bilirubin: 0.5 mg/dL (ref 0.3–1.2)

## 2010-11-03 LAB — POCT CARDIAC MARKERS
Myoglobin, poc: 104 ng/mL (ref 12–200)
Troponin i, poc: 0.32 ng/mL (ref 0.00–0.09)

## 2010-11-03 LAB — APTT: aPTT: 24 s (ref 24–37)

## 2010-11-03 LAB — HOMOCYSTEINE: Homocysteine: 11.8 umol/L (ref 4.0–15.4)

## 2010-11-03 LAB — PROTIME-INR
INR: 1.06 (ref 0.00–1.49)
Prothrombin Time: 13.7 seconds (ref 11.6–15.2)

## 2010-11-03 LAB — HEMOGLOBIN A1C: Mean Plasma Glucose: 143 mg/dL — ABNORMAL HIGH (ref <117)

## 2010-11-03 LAB — GLUCOSE, CAPILLARY
Glucose-Capillary: 123 mg/dL — ABNORMAL HIGH (ref 70–99)
Glucose-Capillary: 138 mg/dL — ABNORMAL HIGH (ref 70–99)

## 2010-11-03 LAB — CK TOTAL AND CKMB (NOT AT ARMC): Relative Index: INVALID (ref 0.0–2.5)

## 2010-12-01 ENCOUNTER — Encounter: Payer: Self-pay | Admitting: Internal Medicine

## 2010-12-01 ENCOUNTER — Observation Stay (HOSPITAL_COMMUNITY)
Admission: EM | Admit: 2010-12-01 | Discharge: 2010-12-02 | Disposition: A | Discharge status: Home / Self Care | Payer: Medicare Other | Attending: Emergency Medicine | Admitting: Emergency Medicine

## 2010-12-01 ENCOUNTER — Ambulatory Visit (INDEPENDENT_AMBULATORY_CARE_PROVIDER_SITE_OTHER): Payer: Medicare Other | Admitting: Internal Medicine

## 2010-12-01 DIAGNOSIS — I10 Essential (primary) hypertension: Secondary | ICD-10-CM

## 2010-12-01 DIAGNOSIS — E860 Dehydration: Principal | ICD-10-CM | POA: Insufficient documentation

## 2010-12-01 DIAGNOSIS — I252 Old myocardial infarction: Secondary | ICD-10-CM | POA: Insufficient documentation

## 2010-12-01 DIAGNOSIS — R110 Nausea: Secondary | ICD-10-CM | POA: Insufficient documentation

## 2010-12-01 DIAGNOSIS — I2510 Atherosclerotic heart disease of native coronary artery without angina pectoris: Secondary | ICD-10-CM | POA: Insufficient documentation

## 2010-12-01 DIAGNOSIS — R630 Anorexia: Secondary | ICD-10-CM | POA: Insufficient documentation

## 2010-12-01 DIAGNOSIS — R1013 Epigastric pain: Secondary | ICD-10-CM

## 2010-12-01 DIAGNOSIS — R112 Nausea with vomiting, unspecified: Secondary | ICD-10-CM

## 2010-12-01 LAB — HEPATIC FUNCTION PANEL
Alkaline Phosphatase: 82 U/L (ref 39–117)
Bilirubin, Direct: 0.1 mg/dL (ref 0.0–0.3)
Total Bilirubin: 0.9 mg/dL (ref 0.3–1.2)

## 2010-12-01 LAB — CBC WITH DIFFERENTIAL/PLATELET
Basophils Absolute: 0 10*3/uL (ref 0.0–0.1)
Eosinophils Absolute: 0 10*3/uL (ref 0.0–0.7)
Lymphocytes Relative: 6.7 % — ABNORMAL LOW (ref 12.0–46.0)
MCHC: 33.5 g/dL (ref 30.0–36.0)
Monocytes Absolute: 1 10*3/uL (ref 0.1–1.0)
Neutrophils Relative %: 87.5 % — ABNORMAL HIGH (ref 43.0–77.0)
Platelets: 364 10*3/uL (ref 150.0–400.0)
RBC: 4.91 Mil/uL (ref 3.87–5.11)
RDW: 13.8 % (ref 11.5–14.6)

## 2010-12-01 LAB — BASIC METABOLIC PANEL
BUN: 27 mg/dL — ABNORMAL HIGH (ref 6–23)
CO2: 34 mEq/L — ABNORMAL HIGH (ref 19–32)
Calcium: 10.4 mg/dL (ref 8.4–10.5)
Creatinine, Ser: 1.6 mg/dL — ABNORMAL HIGH (ref 0.4–1.2)
Glucose, Bld: 158 mg/dL — ABNORMAL HIGH (ref 70–99)

## 2010-12-01 LAB — CK TOTAL AND CKMB (NOT AT ARMC): Total CK: 41 U/L (ref 7–177)

## 2010-12-01 LAB — AMYLASE: Amylase: 147 U/L — ABNORMAL HIGH (ref 27–131)

## 2010-12-01 LAB — TROPONIN I: Troponin I: 0.08 ng/mL — ABNORMAL HIGH (ref <0.06)

## 2010-12-01 NOTE — Progress Notes (Signed)
  Subjective:    Patient ID: [name], female    DOB: 03-Mar-1914, 77 y.o.   MRN: [id number]  HPI  ABDOMINAL PAIN with N&V   Location: epigastric Onset: 11/27/2010 as nausea; pain started later  same day ; vomiting as of 04/16   Radiation: none Severity: up to 8  Quality: unable to quatnitate/ qualify Duration: 3 days Better with: no interventions Worse with: eating or drinking Symptoms: Nausea/Vomiting: yes, twice  04/16 & once this am  Diarrhea: no  Constipation: no  Melena/BRBPR: no  Hematemesis: no  Anorexia: yes    Fever/Chills: no  Dysuria: no  Rash: no  Wt loss: yes, ? #  NSAIDs/ASA: yes, 81 mg ASA daily  Gyn pain / bleeding: no  Past Surgeries: no abdominal surgery; no PMH of GI disease       Review of Systems no chest pain or dyspnea     Objective:   Physical Exam She is in no acute distress; she appears frail. She does need help getting on the exam table.  Skin is warm but  minimally damp. No jaundice. Tenting present.  She has dense arcus senilis; ptosis of the right eye. There is no conjunctival inflammation or scleral icterus.  She has no lymphadenopathy about her neck or axilla.  The oropharynx reveals good dental  hygiene. She has no oroharyngeal erythema  Breath sounds are decreased;  no increased work of breathing. She has minimal rales at the bases  Cardiac: slow S4. There is a grade 1 systolic murmur @ the base  Bowel sounds are present and abdomen is nontender in all quadrants. The abdomen is somewhat doughy in lower quarants  Carotid and radial artery pulses are intact. There is no aortic aneurysm  Pedal pulses are decreased.  She has no cyanosis, clubbing or edema.  Auditory acuity is markedly decreased.  Psych:  Cognition and judgment appear intact. Affect flat but  communicative  and cooperative with normal attention span and concentration. Oriented x3       Assessment & Plan:  #1 abdominal pain, epigastric  #2 nausea and  vomiting  #3 coronary disease past medical history with non-Q-wave MI in 2001  #4 hypertension past medical history. At this time her blood pressure is low.  Plan: #1 extensive labs to include cardiac enzymes.  #2 she'll be given samples of Nexium 40 mg daily to take.  #3 she'll be asked to stop the hydralazine at present.   #4 Stool course will be checked.  If symptoms fail to respond to the medication, it is essential she go to  emergency room a copy of this note.

## 2010-12-01 NOTE — Patient Instructions (Signed)
Please take the Nexium 40 mg each day. Stop  hydralazine; your blood pressure is low.  Please complete the stool cards. If your symptoms worsen please take this report to the emergency room.

## 2010-12-02 ENCOUNTER — Emergency Department (HOSPITAL_COMMUNITY): Payer: Medicare Other

## 2010-12-02 LAB — BASIC METABOLIC PANEL
CO2: 29 mEq/L (ref 19–32)
Calcium: 8.7 mg/dL (ref 8.4–10.5)
Chloride: 93 mEq/L — ABNORMAL LOW (ref 96–112)
GFR calc Af Amer: 30 mL/min — ABNORMAL LOW (ref >60)
Glucose, Bld: 135 mg/dL — ABNORMAL HIGH (ref 70–99)
Sodium: 131 mEq/L — ABNORMAL LOW (ref 135–145)

## 2010-12-02 LAB — POCT I-STAT, CHEM 8
Calcium, Ion: 1.12 mmol/L (ref 1.12–1.32)
Hemoglobin: 14.3 g/dL (ref 12.0–15.0)
Sodium: 131 mEq/L — ABNORMAL LOW (ref 135–145)
TCO2: 32 mmol/L (ref 0–100)

## 2010-12-02 LAB — POCT CARDIAC MARKERS: Myoglobin, poc: 264 ng/mL (ref 12–200)

## 2010-12-21 ENCOUNTER — Other Ambulatory Visit (INDEPENDENT_AMBULATORY_CARE_PROVIDER_SITE_OTHER): Payer: Medicare Other

## 2010-12-21 DIAGNOSIS — Z1211 Encounter for screening for malignant neoplasm of colon: Secondary | ICD-10-CM

## 2010-12-21 LAB — HEMOCCULT GUIAC POC 1CARD (OFFICE): Card #2 Fecal Occult Blod, POC: NEGATIVE

## 2010-12-29 NOTE — Assessment & Plan Note (Signed)
[facility]                            CARDIOLOGY OFFICE NOTE   NAME:Meckley, GLADIES SOFRANKO                        MRN:          [id number]  DATE:11/30/2007                            DOB:          1914-04-25    Massie Bougie returns for followup with [facility] Cardiology Office on November 30, 2007.  [name] is a delightful 77 year old woman who has been  followed by Dr. Samule Ohm.  I will be assuming her cardiac care from here  forward.  She has a history of coronary artery disease and was treated  with stenting of the left circumflex back in 2001.  She also has carotid  stenosis that has been followed with serial ultrasounds.  She has lived  at [facility] for the past 8 years.   Ms. Rubey complains of multiple symptoms today.  She has had itching and  right facial swelling.  Her legs feel tired and she has been dizzy.  She  also complains of forgetfulness.  She attributes many of her symptoms to  Lipitor.  In reviewing her records, it looks like she had Lipitor for  the past few years and she thinks that her symptoms have been  progressive over the last several months.  She has occasional chest pain  that is stable.  She takes approximately 2 nitroglycerin per month.  Her  chest pain is not related to exertion.  She has no dyspnea, orthopnea,  PND or edema.   Medications include Plavix 75 mg daily, Diovan 160/12.5 mg daily,  metoprolol 25 mg twice daily, Centrum Silver multivitamin, fish oil  daily, Lipitor 40 mg daily.   ALLERGIES:  Include ALTACE.   EXAM:  She is an elderly woman in no distress.  Weight 138, blood pressure 152/68, heart rate 60, respiratory rate 18.  HEENT:  Normal.  NECK:  Normal carotid upstrokes with a loud left carotid bruit.  LUNGS:  Clear bilaterally.  Heart regular rate and rhythm with a 1/6 systolic ejection murmur along  left sternal border.  ABDOMEN:  Soft, nontender no organomegaly.  No abdominal bruits.  EXTREMITIES:  No  clubbing, cyanosis or edema.  Peripheral pulses 2+ and  equal.   EKG shows sinus rhythm and otherwise within normal limits.   ASSESSMENT:  1. Ms. Wycoff appears stable from cardiac standpoint.  Her chest pain      pattern is unchanged over several years.  Will continue with      secondary risk reduction measures as outlined.  She is on beta      blocker, statin, and Plavix.  2. Hypertension.  Continue current therapy with Diovan and      hydrochlorothiazide, and metoprolol.  3. Carotid stenosis.  Recent carotid ultrasound from November 2008      showed 60-79% stenosis on the left with less significant stenosis      on the right.  No signs or symptoms of stroke or TIA.  Will follow      up with a 1-year carotid ultrasound.  4. Dyslipidemia.  She has been managed by Dr. Alwyn Ren.  She  is      convinced that her multitude of symptoms are from Lipitor.  I told      her it would be okay to give herself a 2-week holiday off Lipitor      to see if things improve.  I will contact her in 2 weeks to decide      whether to have her resume this medicine or to give a trial to a      different medication.   For followup, I would like to see her back in 1 year.     Veverly Fells. Excell Seltzer, MD  Electronically Signed    MDC/MedQ  DD: 11/30/2007  DT: 11/30/2007  Job #: [id number]   cc:   Titus Dubin. Alwyn Ren, MD,FACP,FCCP

## 2011-01-01 NOTE — H&P (Signed)
NAME:  [name], [name]                           ACCOUNT NO.:  [id number]   MEDICAL RECORD NO.:  [id number]                   PATIENT TYPE:  EMS   LOCATION:  MAJO                                 FACILITY:  [facility]   PHYSICIAN:  Arvilla Meres, MD [facility]            DATE OF BIRTH:  10-Jul-1914   DATE OF ADMISSION:  05/28/2002  DATE OF DISCHARGE:                                HISTORY & PHYSICAL   PRIMARY CARE PHYSICIAN:  Dr. Titus Dubin. Alwyn Ren, M.D. [facility]   CARDIOLOGIST:  Dr. Carole Binning, M.D. [facility]   HISTORY:  The patient is a very pleasant 77 year old, white female with a  history of hypertension, hyperlipidemia, and coronary artery disease status-  post non-ST elevation MI in March of 2001, who is admitted through the ER  for atypical chest pain.   Her cardiac history dates back to March of 2001, when she experienced an non-  ST elevation MI.  Cath revealed an EF of 60% with akinesis of the lateral  wall.  The LAD had a 50% had a blockage in the mid section and the second  diagonal had a 50% blockage in the ostial portion and a 75% blockage in the  mid section.  The left circumflex was 100% occluded in the mid section and  it was full of thrombus.  The RCA was 50% proximal, 75% mid, and 25% distal  stenoses.  There was an attempted PTCA of the left circumflex, but it was  unsuccessful secondary to thrombus and remained a total occlusion.  She did  undergo a successful PTCA and stent of the mid RCA with 3.0 x 13 mm Tetra  Stent reducing the lesion to 0%.   Since that time, she has been doing extremely well ambulating with only very  rare episodes of chest pain.  She had a negative stress test last month as  an outpatient, although, she denies any chest pain at that time.  Today at  approximately 2:00 a.m. she was lying awake in bed when she had the sudden  onset  of sharp left-sided chest pain.  There was no associated shortness of  breath, diaphoresis, nausea and vomiting.  She took  three nitroglycerins  with minimal relief, so she came to the ER for further evaluation.  In the  ER, her pain resolved spontaneously, but would return with deep breathing.  Her EKG and cardiac markers showed no evidence of ischemia.   REVIEW OF SYSTEMS:  She is very functional. She does all of her ADLs.  She  denies any fevers, chills, nausea, vomiting, abdominal pain, CHF, melena, or  bright red blood per rectum.   PAST MEDICAL HISTORY:  1. CAD as per HPI status post non-STEMI  in 03/01, with a successful PTCA     and stent of the RCA.  Unsuccessful PTCA of left circumflex.  2. Hypertension.  3. Hyperlipidemia.  4. Gallstones.  5. Glaucoma.  6. Cataracts.  7. Hard of hearing.  8. Osteoarthritis.   MEDICATIONS:  Diovan/Hydrochlorothiazide 160/12.5 mg q.d., Metoprolol 25  b.i.d., Pravachol 40 q.d., Enteric-Coated Aspirin 81 mg q.d., Betoptic  eyedrops, Alphagan eyedrops, Multivitamin with Iron, Vitamin B12, Vitamin C,  Garlic and Vitamin E.   ALLERGIES:  NO KNOWN DRUG ALLERGIES.   SOCIAL HISTORY:  She is retired.  She lives at a friend's home in [facility].  She denies tobacco or alcohol.   FAMILY HISTORY:  Noncontributory.   PHYSICAL EXAMINATION:  GENERAL:  An elderly female in no acute distress.  She is alert and oriented times 3.  VITAL SIGNS:  She is afebrile, blood pressure 136/68, heart rate 60, she is  sating 95% on room air.  HEENT:  Her sclerae are anicteric.  The right conjunctivae is mildly  injected.  NECK:  Supple.  Her jugular venous pressure is approximately 6 cm of water  with prominent C-V waves.  Her carotids are 2+ bilaterally without bruits.  CHEST:  Her chest wall has marked point tenderness on the lateral aspect  reproducing her symptoms.  CARDIAC:  Regular rate and rhythm.  Normal S1 and S2 with 2/6 systolic  ejection murmur at the left upper sternal border.  LUNGS:  Clear to auscultation.  ABDOMEN:  Benign.  EXTREMITIES:  No cyanosis,  clubbing, or edema.  Her dorsalis pedis and  femoral pulses are 2+ bilaterally.  There are no bruits.   LABORATORY DATA:  White count 7.3, hematocrit 35, platelets 289, MCV 85,  sodium 140, potassium 4.1, chloride 105, CO2 29, BUN 19, creatinine 1.0,  glucose 123, CK 49 and MB 1.4, troponin 0.02.  EKG shows sinus bradycardia  at 55 with septal Q waves.  There is no ST or T wave changes.  Portable  chest s-ray shows bibasilar atelectasis, otherwise no air space disease.   ASSESSMENT/PLAN:  This is an 77 year old, white female who as above with a  history of coronary artery disease status-post a myocardial infarction in  March of 2001, who presents with atypical chest pain.  By history and exam  she appears to have chest wall pain with a very low suspicion for myocardial  ischemia.  Given her age and previous myocardial infarction, I think it is  reasonable to observe today and ruler her out for myocardial infarction.  If  she rules out with serial cardiac markers, I do not think she will require  further restratification and can be discharged home with the continuation of  her excellent medical therapy.  For now, we will continue her Aspirin, beta  blocker, and statin and treat her chest wall pain symptomatically.                                                 Arvilla Meres, MD [facility]    DB/MEDQ  D:  05/28/2002  T:  05/29/2002  Job:  [id number]

## 2011-01-01 NOTE — Cardiovascular Report (Signed)
[place]  Patient:    [name], [name]                        MRN: [id number] Proc. Date: 10/27/99 Adm. Date:  [id number] Attending:  Lewayne Bunting CC:         [name]. Alwyn Ren, M.D. [facility]             Rudene Christians. Ladona Ridgel, M.D. [facility]             Cardiac Catheterization Lab                        Cardiac Catheterization  PROCEDURES PERFORMED: 1. Left heart catheterization. 2. Coronary angiography. 3. Left ventriculography. 4. Attempted PTCA of the mid left circumflex.  INDICATIONS:  [name] is an 77 year old woman who is admitted on October 23, 1999 with a non-Q-wave myocardial infarction.  She has had no chest pain since admission.  She was referred for cardiac catheterization.  CATHETERIZATION PROCEDURAL NOTE:  A 6-French sheath was placed in the right femoral artery.  Standard Judkins 6-French catheters were utilized.  CONTRAST:  Omnipaque.  COMPLICATIONS:  None.  CATHETERIZATION RESULTS:  HEMODYNAMICS: 1. Left ventricular pressure:  144/20. 2. Aortic pressure:  144/60.  There was no aortic valve gradient.  LEFT VENTRICULOGRAM:   There is mild area of akinesis in the lateral wall. Ejection fraction is estimated at greater than 60%.  CORONARY ANGIOGRAPHY: (Right dominant). 1. Left Main:  Normal. 2. Left Anterior Descending:  Has a mid 50% stenosis.  There is a small first    diagonal, a normal-sized second diagonal (which has a 50% stenosis at its    origin), and a 75% stenosis in the body), and a small third diagonal. 3. Left Circumflex:  This is 100% occluded in the mid vessel.  It appears to be a    fairly long occlusion with thrombus.  There is a small first and small second    marginal.  There is a normal to large third marginal after the occlusion,    which fills via left-to-left collaterals.  There are two small posterolateral    branches arising from the distal circumflex. 4. Right Coronary Artery:  Dominant vessel.  It has a 50%  stenosis proximally,  75% stenosis in the mid vessel, and a 25% stenosis in the distal vessel.    There is a normal-sized posterior descending artery, a small first    posterolateral branch, and a normal second posterolateral branch.  IMPRESSION: 1. Left ventricular systolic function, characterized by mild lateral akinesis    but preserved ejection fraction. 2. Three-vessel coronary artery disease, as described.  The culprit lesion is    100% occlusion of the mid circumflex.  There is significant disease in the    mid right coronary artery.  The LAD has moderate disease, as described.  PLAN:  Attempt at percutaneous intervention of the left circumflex.  PTCA PROCEDURAL NOTE:  At the completion of the diagnostic catheterization these findings were discussed with the patient; we opted to proceed with an attempt at recanalization of the left circumflex.  We initially used a 7-French Voda left .5 guiding catheter, and then ultimately changed to a 3.0 guiding catheter.  The patient had already been on Integrilin prior to coming to the laboratory, and this was continued.  We also gave heparin per protocol.  We initially attempted to cross the lesion with a Graphix  PT wire.  This did successfully cross the lesion; however, it could not be torqued such that it would go into the obtuse marginal  branch.  Instead, it would continue to go down into the AV circumflex.  We likewise tried high-torque floppy wire and a traverse wire with the same results.  At that point we opted to pass a 2.0 x 20 mm Ranger balloon over the wire to dilate the  proximal portion of the lesion.  The balloon was positioned and three inflations were performed over the proximal area of the lesion.  However, it would not cross the distal stenosis.  Again with the balloon as a backup support, we attempted o reposition the wire into the obtuse marginal branch; however, because of angulation the obtuse marginal off  the left circumflex the wire would not pass into this branch.  During the case we intermittently had improved flow into the distal vessel.  However, ultimately at the conclusion of the procedure the vessel remained 100% occluded with TIMI-0 flow and collateral filling of the obtuse marginal branch.  At that point the patient remained hemodynamically stable and free of chest pain.  Because of the relatively small area of myocardium served by this vessel and the completed infarct, we opted to discontinue the procedure at that  point.  There were no complications.  RESULTS:  Unsuccessful attempt at PTCA of a long total occlusion of the mid left circumflex.  The vessel remained 100% occluded at the conclusion of the procedure. There were no complications.  PLAN:  Integrilin will be discontinued at this point.  The patient will be treated medically.  We will review her cineangiograms and consider proceeding with staged elective intervention of the mid right coronary artery, pending further discussions with physicians and the patient. DD:  10/27/99 TD:  10/27/99 Job: [id number]

## 2011-01-01 NOTE — Assessment & Plan Note (Signed)
[place] [facility]                            CARDIOLOGY OFFICE NOTE   NAME:Oppedisano, CICELY ORTNER                        MRN:          [id number]  DATE:12/06/2006                            DOB:          1914-06-19    HISTORY OF PRESENT ILLNESS:  Ms. Shoun is a 77 year old lady who  suffered a non-ST elevation myocardial infarction in March 2001.  This  was treated with bare metal stenting to the circumflex.  She  subsequently underwent bare metal stenting to the right coronary artery.  Since then, she has done very well.  She has some rare chest discomfort  occurring at rest, which has been her pattern previously.  She does not  have any exertional chest discomfort, exertional dyspnea, PND,  orthopnea, edema, palpitations, claudication, syncope or pre-syncope.  She has had no falls and no symptoms concerning for TIA or stroke.  She  continues to drive and lives at a friend's home.   CURRENT MEDICATIONS:  1. Diovan.  2. Hydrochlorothiazide 160/12.5 mg, one daily.  3. Metoprolol 25 mg b.i.d.  4. Betoptic eye drops.  5. Multivitamin.  6. Fish oil 1000 mg daily.  7. Lumigan eye drops.  8. Lipitor 40 mg daily.  9. Vitamin C.  10.Paxil 75 mg daily.  11.Omeprazole 20 mg daily.   PHYSICAL EXAMINATION:  GENERAL:  She is generally well-appearing, in no  distress.  VITAL SIGNS:  Heart rate 55, blood pressure 170/70, weight 134 pounds.  Her blood pressure is more elevated now than it has been in the past.  NECK:  She has no jugular venous distention, thyromegaly or  lymphadenopathy.  LUNGS:  Clear to auscultation.  HEART:  A regular rate and rhythm with normal S1 and S2.  There is no  murmur or S3.  ABDOMEN:  Soft, non-distended, nontender.  There is no  hepatosplenomegaly or mass.  Bowel  sounds are normal.  EXTREMITIES:  Without clubbing, cyanosis or edema.  No ulcerations.  PULSES:  Carotid pulses 2+ bilaterally with a soft bruit on the left.  Femoral pulses  2+ bilaterally.  NEUROLOGIC:  She is alert and oriented x3 with normal affect and normal  neurologic exam.   Electrocardiogram demonstrates sinus bradycardia with possible septal  infarction.  It is without significant change compared with November 29, 2005.   IMPRESSION/RECOMMENDATIONS:  1. Coronary artery disease, doing well after a non-ST elevation      myocardial infarction:  Continue Plavix, metoprolol and Diovan.  2. Hypertension:  Blood pressure is elevated today, which is unusual      for her.  Will recheck at the time of the carotid duplex.  3. Hypercholesterolemia:  Managed by Dr. Titus Dubin. Hopper.  Goal LDL      is less than 70.  4. Carotid disease:  Repeat carotid duplex for known disease.  She is      asymptomatic.   FOLLOWUP:  Will schedule her for followup with Dr. Madolyn Frieze. [place] in  one year's time.     Salvadore Farber, MD  Electronically Signed    WED/MedQ  DD: 12/06/2006  DT: 12/06/2006  Job #: [id number]

## 2011-01-01 NOTE — Discharge Summary (Signed)
NAME:  LISVET, RASHEED                           ACCOUNT NO.:  [id number]   MEDICAL RECORD NO.:  [id number]                   PATIENT TYPE:  OBV   LOCATION:  4705                                 FACILITY:  [facility]   PHYSICIAN:  Salvadore Farber, M.D. [facility]         DATE OF BIRTH:  Nov 17, 1913   DATE OF ADMISSION:  05/28/2002  DATE OF DISCHARGE:  05/29/2002                                 DISCHARGE SUMMARY   HISTORY OF PRESENT ILLNESS:  The patient is an 77 year old white female who  presented to the emergency room with chest discomfort.  She stated that at  around 2 a.m. on May 28, 2002, while lying awake in bed, she had the  sudden onset of sharp left-sided chest discomfort.  No associated shortness  of breath, diaphoresis, nausea, or vomiting.  She took three sublingual  nitroglycerin with minimal relief, so she came to the emergency room.  In the emergency room her discomfort resolved spontaneously; however, it  returned with deep breathing.  Her electrocardiogram and initial blood work  was unremarkable.  Her history is notable for a non-Q-wave myocardial  infarction, with a heart catheterization in March 2001.  The catheterization  showed an ejection fraction of 60%, lateral akinesis, a 50% mid-LAD, a 75%  diagonal-II, a 100% mid-circumflex which is full of thrombus, a 50% proximal  RCA, a 75% mid-RCA, a 25% distal RCA.  An attempted angioplasty to the  circumflex was unsuccessful, secondary to the thrombus.  The angioplasty and  stenting to the mid-RCA was performed without difficulty.  She also has a  history of hypertension, hyperlipidemia, gallstones, glaucoma, plus  cataracts, and is hard of hearing, and also osteoarthritis.   LABORATORY DATA:  In 2/3 of CK isoenzymes and troponins were negative for a  myocardial infarction.  The H&H was 11.4 and 34.9, with normal indices,  platelets 289, wbc's 7.3.  Sodium 140, potassium 4.1, glucose 123, BUN 19.  Hemoglobin A1c pending at  the time of this dictation.  ELECTROCARDIOGRAM:  Showed sinus bradycardia, non-specific ST-T wave  changes.   HOSPITAL COURSE:  The patient was admitted to 8.  Overnight she ruled out  for a myocardial infarction.  On the morning of May 29, 2002, Dr.  Salvadore Farber felt that her discomfort was musculoskeletal.  He ordered  left rib films which are pending at the time of this dictation, and a third  set of enzymes.  He felt that if the third set of enzymes was negative, she  could be discharged home with followup as an outpatient with Dr. Titus Dubin.  Hopper, in regards to the chest films and the need for a mammogram.  Her  third set of blood work was negative.  Thus she was discharged home.   DISCHARGE DIAGNOSES:  1. Probable non-cardiac chest discomfort.  2. History as previously.   DISPOSITION:  She is discharged home.   DISCHARGE MEDICATIONS:  1. She is asked to continue her home medications, which include vitamin B12.  2. Vitamin C.  3. Garlic.  4. Vitamin E.  5. Multivitamin with iron.  6. Betoptic eye drops.  7. Alphagan eye drops.  8. Diovan/HCTZ 160/12.5 mg q.d.  9. Lopressor 25 mg b.i.d.  10.      Pravachol 40 mg q.h.s.  11.      Enteric-coated aspirin 81 mg q.d.   ACTIVITY:  Her activity was not restricted.   DIET:  She was asked to maintain a low-fat, low-salt, low-cholesterol diet.   FOLLOW UP:  She will call tomorrow morning to arrange a follow-up  appointment with Dr. Alwyn Ren.  Dr. Samule Ohm feels that Dr. Alwyn Ren should review  the chest x-ray films that were performed at [facility], and that she needs to arrange an outpatient mammogram.       Joellyn Rued, P.A. [facility]                    Salvadore Farber, M.D. [facility]    EW/MEDQ  D:  05/29/2002  T:  05/29/2002  Job:  [id number]   cc:   Salvadore Farber, M.D. [facility]  [facility]  [address] [place], [place] [address]  Fax: 1   Marga Melnick, M.D.

## 2011-01-01 NOTE — Procedures (Signed)
[place]. [facility]  Patient:    [name], [name]                        MRN: [id number] Proc. Date: 10/29/99 Adm. Date:  [id number] Attending:  Lewayne Bunting CC:         Titus Dubin. Alwyn Ren, M.D. [facility]             Doylene Canning. Ladona Ridgel, M.D. [facility]             Cardiac Catheterization Lab                           Procedure Report  PROCEDURE PERFORMED:  Stent placement in the mid right coronary artery.  INDICATIONS:  [name] is an 77 year old woman who recently suffered a lateral  wall myocardial infarction.  Cardiac catheterization two days ago revealed a totally occluded mid left circumflex with a large amount of thrombus.  She also had a 75% stenosis in the mid right coronary artery.  Attempted recanalization of the left circumflex occlusion was unsuccessful due to significant residual thrombus, a relatively small vessel size, and tortuosity.  The patient was brought back to he laboratory today for elective intervention of the right coronary artery.  PROCEDURAL NOTE:  A 7-French sheath was placed in the right femoral artery.  We  used a 7-French JR4 guiding catheter with sideholes and a BMW wire.  Heparin and Integrilin were administered per protocol.  A 3.0 x 13-mm Tetra stent was placed primarily in the mid right coronary artery.  The stent was deployed at 12 atmospheres for 63 seconds.  Angiographic images revealed an excellent angiographic result with 0% residual stenosis and TIMI-3 flow.  At the conclusion of the procedure, a Perclose vascular closure device was placed in the right femoral artery with good hemostasis.  COMPLICATIONS:  None.  RESULTS:  Successful primary stent placement in the mid right coronary artery reducing a 75% stenosis to 0% residual with TIMI-3 flow.  PLAN:  Integrilin will be continued for 20 hours.  Plavix will be administered or four weeks. DD:  10/29/99 TD:  10/29/99 Job: [id number]

## 2011-01-01 NOTE — Discharge Summary (Signed)
[place]  Patient:    [name], [name]                        MRN: [id number] Adm. Date:  [id number] Disc. Date: [id number] Attending:  Lewayne Bunting Dictator:   Lavella Hammock, P.A. CC:         Titus Dubin. Alwyn Wall, M.D. [facility]             Rudene Christians. Ladona Wall, M.D. [facility]             [facility], P.A.C. [facility]                           Discharge Summary  DATE OF BIRTH:   [birth year]  PROCEDURES: 1. Cardiac catheterization. 2. Coronary arteriogram. 3. Left ventriculogram. 4. PTCA and stent to one vessel. 5. Attempted PTCA to second vessel.  HOSPITAL COURSE:  [name] is an 77 year old female with no history of coronary artery disease or MI who began having chest pain on the night before admission while watching TV.  She took one aspirin without relief and, although the pain was continuous, she was able to sleep.  She was still having pain when she awoke this morning and spoke to the nurse about her chest pain at [facility] where she resides, and it was recommended that she come to the emergency room.  She got some relief with nitroglycerin, and her EKG was without acute changes, so she was started on a nitroglycerin drip; but her blood pressure dropped precipitiously from 244/103 to 90 systolic, and the nitroglycerin drip was discontinued.  Her initial CK-MB was 131/14, so she was admitted for acute coronary syndrome and started on heparin, Integrilin, and aspirin.  She was scheduled for a cardiac catheterization, and this was done on October 27, 1999.  The cardiac catheterization showed a left main with a 50% mid lesion at the ostium of the second diagonal and diagonal-2 with a 50% ostial and 75% mid lesion.  Diagonal-3 had a 60% lesion.  The circumflex was totalled in the mid portion.  The RCA had a 50% proximal, 75% mid, and 25% distal lesion.  PTCA of the circumflex was attempted, but they could not cross the lesion with the balloon and were  unable to access the vessel.  She had her Integrilin discontinued; and, upon review of the films, it was felt that she would benefit from percutaneous intervention on her RCA, and this was scheduled for October 29, 1999.  She had PTCA and stent to her RCA on October 29, 1999, and this was successful.  She tolerated the procedure well.  She was on Integrilin after that as well for 20 hours.  She had her cholesterol checked, and her total cholesterol was 201 with triglycerides of 221, HDL 39, LDL 118. Because of this, she was started on Baycol.  By October 30, 1999, she had been seen by cardiac rehabilitation, and the use of nitroglycerin, calling 911, as well as restrictions secondary to her MI were discussed.  Risk factors were also reviewed.  She was ambulating without difficulty, and her saturations were 95% on room air with ambulation.  She was considered stable for discharge on October 30, 1999.  LABORATORY VALUES:  Sodium 139, potassium 3.9, chloride 103, CO2 29, BUN 6, creatinine 0.6, glucose 128.  Hemoglobin 10.5, hematocrit 31.6, WBC 9.5, platelets 293.  Total cholesterol 201, triglycerides 221,  HDL 39, LDL 118. CK-MB #1 was 276/50.9; #2, 481/87.2; #3, 47/3.8.  Troponin I #1 was 0.43; #2, 1.34.  Ultrasound of the aorta revealed the aorta mildly ectatic measuring 2.4 x 1.8 cm with an incidental finding of gallstones.  The gallbladder wall is normal in size.  The right kidney measures 11 x 4.3 cm with the left kidney 9.9 x 4.9 cm.  Chest x-ray: Minimal atelectasis versus scarring at the left base.  EKG: Sinus rhythm with nonspecific T wave abnormality.  DISCHARGE CONDITION:  Improved.  CONSULTS:  None.  COMPLICATIONS:  None.  DISCHARGE DIAGNOSES: 1. Myocardial infarction with a totaled circumflex that could not be reopened    this admission and percutaneous transluminal coronary angioplasty and    stent to the right coronary artery. 2. Residual coronary artery disease with a 50%  left anterior descending    artery and totaled circumflex. 3. Preserved left ventricular function. 4. History of cataracts. 5. History of basal cell carcinoma to the nose and face. 6. Status post tonsillectomy and adenoidectomy. 7. History of glaucoma. 8. Hyperlipidemia. 9. Remote history of hypertension.  DISCHARGE INSTRUCTIONS:  Her activity level is to include no driving for two days and no strenuous activity until cleared by M.D.  She is to stick to a low-fat diet.  She is to call the office for bleeding, swelling, or drainage at the catheterization site.  She is to get a fasting lipid panel in two months in the [facility].  She has a P.A. appointment on March 30 at 10:30 a.m. for [name].  She is to follow up with [name] as needed.  DISCHARGE MEDICATIONS: 1. Plavix 75 mg q.d. x 1 month. 2. Lopressor 50 mg 1/2 tablet b.i.d. 3. Coated aspirin 325 mg q.d. 4. Baycol 0.4 mg q.d. 5. Nitroglycerin 0.4 mg sublingual p.r.n. 6. Multivitamin with iron. 7. Vitamin C in gelatin capsule as taken prior to admission. 8. Vitamin E and garlic on hold while on Plavix and may resume after Plavix    is completed. DD:  10/30/99 TD:  10/30/99 Job: [id number]

## 2011-01-27 ENCOUNTER — Emergency Department (HOSPITAL_COMMUNITY)
Admission: EM | Admit: 2011-01-27 | Discharge: 2011-01-27 | Disposition: A | Discharge status: Home / Self Care | Payer: Medicare Other | Attending: Emergency Medicine | Admitting: Emergency Medicine

## 2011-01-27 ENCOUNTER — Emergency Department (HOSPITAL_COMMUNITY): Payer: Medicare Other

## 2011-01-27 DIAGNOSIS — I252 Old myocardial infarction: Secondary | ICD-10-CM | POA: Insufficient documentation

## 2011-01-27 DIAGNOSIS — R0989 Other specified symptoms and signs involving the circulatory and respiratory systems: Secondary | ICD-10-CM | POA: Insufficient documentation

## 2011-01-27 DIAGNOSIS — R112 Nausea with vomiting, unspecified: Secondary | ICD-10-CM | POA: Insufficient documentation

## 2011-01-27 DIAGNOSIS — I2510 Atherosclerotic heart disease of native coronary artery without angina pectoris: Secondary | ICD-10-CM | POA: Insufficient documentation

## 2011-01-27 DIAGNOSIS — R079 Chest pain, unspecified: Secondary | ICD-10-CM | POA: Insufficient documentation

## 2011-01-27 DIAGNOSIS — R0609 Other forms of dyspnea: Secondary | ICD-10-CM | POA: Insufficient documentation

## 2011-01-27 LAB — CBC
MCV: 82.6 fL (ref 78.0–100.0)
Platelets: 370 10*3/uL (ref 150–400)
RDW: 13.2 % (ref 11.5–15.5)
WBC: 13.7 10*3/uL — ABNORMAL HIGH (ref 4.0–10.5)

## 2011-01-27 LAB — COMPREHENSIVE METABOLIC PANEL
Albumin: 3.8 g/dL (ref 3.5–5.2)
Alkaline Phosphatase: 86 U/L (ref 39–117)
BUN: 18 mg/dL (ref 6–23)
Calcium: 10.1 mg/dL (ref 8.4–10.5)
Creatinine, Ser: 1.07 mg/dL (ref 0.4–1.2)
Potassium: 3.9 mEq/L (ref 3.5–5.1)
Total Protein: 7.3 g/dL (ref 6.0–8.3)

## 2011-01-27 LAB — URINALYSIS, ROUTINE W REFLEX MICROSCOPIC
Nitrite: NEGATIVE
Specific Gravity, Urine: 1.017 (ref 1.005–1.030)
pH: 6.5 (ref 5.0–8.0)

## 2011-01-27 LAB — LIPASE, BLOOD: Lipase: 32 U/L (ref 11–59)

## 2011-01-27 LAB — URINE MICROSCOPIC-ADD ON

## 2011-01-28 LAB — URINE CULTURE
Colony Count: NO GROWTH
Culture: NO GROWTH

## 2011-01-29 ENCOUNTER — Emergency Department (HOSPITAL_COMMUNITY): Payer: Medicare Other

## 2011-01-29 ENCOUNTER — Ambulatory Visit: Payer: Medicare Other | Admitting: Family Medicine

## 2011-01-29 ENCOUNTER — Inpatient Hospital Stay (HOSPITAL_COMMUNITY)
Admission: EM | Admit: 2011-01-29 | Discharge: 2011-01-31 | DRG: 445 | Disposition: A | Discharge status: Nursing Home (Includes ICF) | Payer: Medicare Other | Attending: Family Medicine | Admitting: Family Medicine

## 2011-01-29 DIAGNOSIS — Z79899 Other long term (current) drug therapy: Secondary | ICD-10-CM

## 2011-01-29 DIAGNOSIS — I2510 Atherosclerotic heart disease of native coronary artery without angina pectoris: Secondary | ICD-10-CM | POA: Diagnosis present

## 2011-01-29 DIAGNOSIS — K5900 Constipation, unspecified: Secondary | ICD-10-CM | POA: Diagnosis present

## 2011-01-29 DIAGNOSIS — E785 Hyperlipidemia, unspecified: Secondary | ICD-10-CM | POA: Diagnosis present

## 2011-01-29 DIAGNOSIS — I10 Essential (primary) hypertension: Secondary | ICD-10-CM | POA: Diagnosis present

## 2011-01-29 DIAGNOSIS — E871 Hypo-osmolality and hyponatremia: Secondary | ICD-10-CM | POA: Diagnosis present

## 2011-01-29 DIAGNOSIS — H409 Unspecified glaucoma: Secondary | ICD-10-CM | POA: Diagnosis present

## 2011-01-29 DIAGNOSIS — H3530 Unspecified macular degeneration: Secondary | ICD-10-CM | POA: Diagnosis present

## 2011-01-29 DIAGNOSIS — Z7982 Long term (current) use of aspirin: Secondary | ICD-10-CM

## 2011-01-29 DIAGNOSIS — K8020 Calculus of gallbladder without cholecystitis without obstruction: Principal | ICD-10-CM | POA: Diagnosis present

## 2011-01-29 DIAGNOSIS — Z8673 Personal history of transient ischemic attack (TIA), and cerebral infarction without residual deficits: Secondary | ICD-10-CM

## 2011-01-29 DIAGNOSIS — M1990 Unspecified osteoarthritis, unspecified site: Secondary | ICD-10-CM | POA: Diagnosis present

## 2011-01-29 DIAGNOSIS — Z9861 Coronary angioplasty status: Secondary | ICD-10-CM

## 2011-01-29 DIAGNOSIS — T502X5A Adverse effect of carbonic-anhydrase inhibitors, benzothiadiazides and other diuretics, initial encounter: Secondary | ICD-10-CM | POA: Diagnosis present

## 2011-01-29 DIAGNOSIS — R627 Adult failure to thrive: Secondary | ICD-10-CM | POA: Diagnosis present

## 2011-01-29 LAB — CK TOTAL AND CKMB (NOT AT ARMC)
CK, MB: 3.5 ng/mL (ref 0.3–4.0)
Relative Index: INVALID (ref 0.0–2.5)
Total CK: 99 U/L (ref 7–177)

## 2011-01-29 LAB — CBC
HCT: 38.5 % (ref 36.0–46.0)
Hemoglobin: 13 g/dL (ref 12.0–15.0)
RDW: 13.1 % (ref 11.5–15.5)
WBC: 13.3 10*3/uL — ABNORMAL HIGH (ref 4.0–10.5)

## 2011-01-29 LAB — DIFFERENTIAL
Basophils Absolute: 0 10*3/uL (ref 0.0–0.1)
Basophils Relative: 0 % (ref 0–1)
Lymphocytes Relative: 12 % (ref 12–46)
Neutro Abs: 10.7 10*3/uL — ABNORMAL HIGH (ref 1.7–7.7)

## 2011-01-29 LAB — COMPREHENSIVE METABOLIC PANEL
ALT: 14 U/L (ref 0–35)
Albumin: 3.7 g/dL (ref 3.5–5.2)
Alkaline Phosphatase: 83 U/L (ref 39–117)
BUN: 25 mg/dL — ABNORMAL HIGH (ref 6–23)
Chloride: 85 mEq/L — ABNORMAL LOW (ref 96–112)
Glucose, Bld: 136 mg/dL — ABNORMAL HIGH (ref 70–99)
Potassium: 3.7 mEq/L (ref 3.5–5.1)
Sodium: 125 mEq/L — ABNORMAL LOW (ref 135–145)
Total Bilirubin: 0.7 mg/dL (ref 0.3–1.2)

## 2011-01-29 LAB — URINALYSIS, ROUTINE W REFLEX MICROSCOPIC
Bilirubin Urine: NEGATIVE
Hgb urine dipstick: NEGATIVE
Ketones, ur: NEGATIVE mg/dL
Nitrite: NEGATIVE
Specific Gravity, Urine: 1.018 (ref 1.005–1.030)
pH: 7 (ref 5.0–8.0)

## 2011-01-29 LAB — URINE MICROSCOPIC-ADD ON

## 2011-01-29 LAB — LIPASE, BLOOD: Lipase: 41 U/L (ref 11–59)

## 2011-01-29 NOTE — H&P (Signed)
[name], [name] NO.:  [id number]  MEDICAL RECORD NO.:  [id number]  LOCATION:  [facility]                         FACILITY:  [facility]  PHYSICIAN:  Andreas Blower, MD       DATE OF BIRTH:  [birth year]  DATE OF ADMISSION:  01/29/2011 DATE OF DISCHARGE:                             HISTORY & PHYSICAL   PRIMARY CARE PHYSICIAN:  Peyton Najjar, M.D.  PRIMARY CARDIOLOGIST:  Veverly Fells. Excell Seltzer, M.D.  CHIEF COMPLAINT:  Nausea and poor p.o. intake.  HISTORY OF PRESENT ILLNESS:  Ms. [name] is a 77 year old Caucasian female with history of coronary artery disease, macular degeneration, TIAs, glaucoma, who presents with the above complaints.  Patient reports that she has a history of intermittent nausea and vomiting for many years, but recently she has had persistent nausea over the last week.  She has had decreased oral intake.  She had initially presented to the ER on January 27, 2011.  She was dehydrated at that time and was given IV fluids and was instructed to make an appointment with her primary care physician.  Patient after discharge again had persistent nausea, had a poor p.o. intake.  She had juice this morning and subsequently vomited that out later as well.  She had an appointment with her primary care physician today, but given her nausea, vomiting, asked her to come to the ER for further evaluation.  She denies any fevers or chills.  Denies any chest pain, shortness of breath.  Denies any abdominal pain.  Denies any headaches or vision changes.  Denies any abdominal pain, diarrhea.  She denies vomiting up any blood and her emesis usually consists of whatever she has recently eaten.  REVIEW OF SYSTEMS:  All systems were reviewed with the patient and was positive as per HPI, otherwise all other systems are negative.  PAST MEDICAL HISTORY: 1. History of coronary artery disease with non-ST elevation MI with     stenting in 2001. 2. History of prior carotid  stenosis. 3. Hypertension. 4. Hyperlipidemia. 5. History of gallstones. 6. Glaucoma. 7. Cataracts. 8. History of osteoarthritis. 9. History of TIA in May 2011.  SOCIAL HISTORY:  Patient used to smoke as a teenager, has not smoked since then.  Usually drinks a glass of wine every 2-3 months.  Denies any illegal drugs or substances.  Currently, resides at a friend's home independent apartment.  FAMILY HISTORY:  Unsure what father and mother died from.  Has 12 total siblings, only one sister is living and is a year older.  HOME MEDICATIONS: 1. Zofran 8 mg every 6 hours as needed for nausea. 2. Lumigan 1 drop both eyes daily. 3. Fish oil over-the-counter 1 tablet p.o. daily. 4. Azopt both eyes 1 drop twice daily. 5. Metoprolol 25 mg p.o. twice daily. 6. Vitamin C over-the-counter 1 tablet p.o. daily. 7. Multivitamin 1 tablet p.o. daily. 8. Valsartan/hydrochlorothiazide 160/12.5 p.o. daily. 9. Aspirin 81 mg p.o. daily.  PHYSICAL EXAMINATION:  VITAL SIGNS:  Temperature is 97.8, blood pressure is 152/73, heart rate 63, respiration 18, satting at 96% on room air. GENERAL:  Patient was alert and oriented x3, was a little bit lethargic after  she received Phenergan, but was oriented x3, was in no acute distress, was lying in bed. HEENT:  Extraocular motions intact.  Slightly dry mucous membranes. NECK:  Supple. HEART:  Regular with S1, S2. LUNGS:  Clear to auscultation bilaterally. ABDOMEN:  Soft, nontender, nondistended.  Positive bowel sounds.  No guarding or rebound tenderness. EXTREMITIES:  Patient has good peripheral pulses with trace edema. NEUROLOGIC:  Cranial nerves II through XII grossly intact.  Had 5/5 motor strength in upper as well as lower extremities.  RADIOLOGY OR IMAGING:  Patient had abdominal series, which shows no acute abdominal abnormalities apart from mild gaseous distention of the stomach.  Cholelithiasis noted.  Stable hyperinflation consistent with COPD  and/or asthma.  No acute cardiopulmonary processes seen.  LABORATORY DATA:  CBC shows a white count of 13.3, hemoglobin 13.0, hematocrit 38.5, platelet count 377,000.  Electrolytes:  Sodium 125, potassium 3.7, chloride 85, CO2 of 30, BUN 25, creatinine 1.07.  Liver function tests normal.  Lipase 41.  UA negative for nitrites and small leukocytes.  Urine culture from January 27, 2011 shows no growth.  ASSESSMENT AND PLAN: 1. Nausea with vomiting, etiology unclear:  Abdominal x-ray shows     cholelithiasis.  I am uncertain if the patient is having chronic     intermittent nausea due to cholelithiasis.  We will get a right     upper quadrant ultrasound.  If the ultrasound shows significant     findings, gallstone etiology, could consider a surgical     consultation.  If the patient had a negative ultrasound and has     persistent nausea, vomiting, could consider gastrointestinal     consultation.  We will start the patient on clear liquid diet     and slowly advance as tolerated. 2. Dehydration due to nausea, vomiting:  We will gently hydrate her     with intravenous fluids. 3. Hyponatremia:  Likely due to dehydration and nausea.  We will     gently rehydrate her and see if her sodium improves.  If the sodium     does not improve, consider further evaluation at that time.  Her     hydrochlorothiazide and valsartan have been held due to     dehydration.  Could consider hydrochlorothiazide and valsartan as     being the cause for the hyponatremia 4. Leukocytosis:  Likely reactive leukocytosis from nausea, vomiting.     Monitor for now. 5. Hypertension:  Continue metoprolol.  Holding valsartan,     hydrochlorothiazide. 6. Failure to thrive:  Likely due to poor p.o. intake.  Management as     indicated above. 7. History of coronary artery disease:  Continue metoprolol and     aspirin. 8. Prophylaxis:  Lovenox for deep venous thrombosis prophylaxis. 9. Code status:  Patient is full code.   This was discussed with     patient and son at the time of admission.  She indicated that she     does not wish to be on life support long-term, but wants life     support short term if needed.  Time spent on admission, talking to the patient's son and coordinating care was 1 hour.   Andreas Blower, MD   SR/MEDQ  D:  01/29/2011  T:  01/29/2011  Job:  [id number]  Electronically Signed by Wardell Heath Cariann Kinnamon  on 01/29/2011 09:36:51 PM

## 2011-01-30 ENCOUNTER — Inpatient Hospital Stay (HOSPITAL_COMMUNITY): Payer: Medicare Other

## 2011-01-30 LAB — BASIC METABOLIC PANEL
BUN: 20 mg/dL (ref 6–23)
CO2: 26 mEq/L (ref 19–32)
Calcium: 8.8 mg/dL (ref 8.4–10.5)
Creatinine, Ser: 0.94 mg/dL (ref 0.50–1.10)
GFR calc non Af Amer: 55 mL/min — ABNORMAL LOW (ref >60)
Glucose, Bld: 102 mg/dL — ABNORMAL HIGH (ref 70–99)
Sodium: 129 mEq/L — ABNORMAL LOW (ref 135–145)

## 2011-01-30 LAB — CBC
MCH: 27.7 pg (ref 26.0–34.0)
MCHC: 32.9 g/dL (ref 30.0–36.0)
MCV: 84.1 fL (ref 78.0–100.0)
Platelets: 286 10*3/uL (ref 150–400)
RDW: 13.3 % (ref 11.5–15.5)

## 2011-01-31 LAB — BASIC METABOLIC PANEL
BUN: 19 mg/dL (ref 6–23)
Calcium: 9.1 mg/dL (ref 8.4–10.5)
Creatinine, Ser: 0.89 mg/dL (ref 0.50–1.10)
GFR calc Af Amer: >60 mL/min (ref >60)
GFR calc non Af Amer: 59 mL/min — ABNORMAL LOW (ref >60)
Glucose, Bld: 96 mg/dL (ref 70–99)
Potassium: 3.3 mEq/L — ABNORMAL LOW (ref 3.5–5.1)

## 2011-02-02 ENCOUNTER — Telehealth: Payer: Self-pay

## 2011-02-02 NOTE — Telephone Encounter (Signed)
Heather from [facility] has been coming out since pt was discharged from hospital and they would like to have verbal order for nursing to come out and help w/ meds and aid for a couple of weeks. They will also help w/ baths until pt regains confidence in being able to shower by herself.

## 2011-02-02 NOTE — Telephone Encounter (Signed)
Please order these services; also I recommend transfer of primary care be made to in house physicians as it will be hard for her to come to office & any hospital admissions would be by Hospitalist service. Such close supervision may help keep her out of hospital

## 2011-02-03 ENCOUNTER — Ambulatory Visit: Payer: Medicare Other | Admitting: Family Medicine

## 2011-02-03 ENCOUNTER — Encounter: Payer: Self-pay | Admitting: Internal Medicine

## 2011-02-03 ENCOUNTER — Ambulatory Visit (INDEPENDENT_AMBULATORY_CARE_PROVIDER_SITE_OTHER): Payer: Medicare Other | Admitting: Internal Medicine

## 2011-02-03 DIAGNOSIS — E871 Hypo-osmolality and hyponatremia: Secondary | ICD-10-CM

## 2011-02-03 DIAGNOSIS — R112 Nausea with vomiting, unspecified: Secondary | ICD-10-CM

## 2011-02-03 DIAGNOSIS — I2510 Atherosclerotic heart disease of native coronary artery without angina pectoris: Secondary | ICD-10-CM

## 2011-02-03 DIAGNOSIS — R1013 Epigastric pain: Secondary | ICD-10-CM

## 2011-02-03 DIAGNOSIS — K8020 Calculus of gallbladder without cholecystitis without obstruction: Secondary | ICD-10-CM

## 2011-02-03 DIAGNOSIS — I10 Essential (primary) hypertension: Secondary | ICD-10-CM

## 2011-02-03 MED ORDER — RANITIDINE HCL 150 MG PO TABS: 150.0000 mg | ORAL_TABLET | Freq: Two times a day (BID) | ORAL | Status: DC

## 2011-02-03 NOTE — Progress Notes (Signed)
  Subjective:    Patient ID: [name], female    DOB: 07-Apr-1914, 77 y.o.   MRN: [id number]  HPI Nausea & vomiting w/o pain Onset: in April but resolved; recurred 1 weeks ago   Severity: up to ? epiosodes Better with: no relief even with Zofran  Worse with: any intake  Symptoms Diarrhea: no  Constipation: yes  Melena/BRBPR: no  Hematemesis: no  Anorexia: yes  Dyspepsia: X 2 days Fever/Chills: minimal temp intermittently  Dysuria: no  Wt loss: no  EtOH use: no  NSAIDs/ASA: no, except 81 mg ASA  Hospital record 6/15-17: gallstones, Na+ 125-129        Review of Systems     Objective:   Physical Exam General appearance : frail  W/o acute  distress.  Eyes: No conjunctival inflammation or scleral icterus is present. Ptosis OD> OS  Oral exam: There is no oropharyngeal erythema or exudate noted.   Heart:  Normal rate and regular rhythm. S1 and S2 normal without gallop, click, rub or other extra sounds . Grade 1/2-1 systolic murmur  @  base   Lungs:Chest clear to auscultation; no wheezes, rhonchi,rales ,or rubs present.No increased work of breathing.   Abdomen: bowel sounds normal, soft and non-tender without masses, organomegaly or hernias noted.  No guarding or rebound   Skin:Warm & dry.  Intact without suspicious lesions or rashes ; no jaundice or tenting  Lymphatic: No lymphadenopathy is noted about the head, neck, or axillary  areas.   Vasc: decreased pedal pulses. Carotid bruits           Assessment & Plan:  #1 N&V #2 gallstones #3 hyponatremia #4 HTN , controlled #5 dyspepsia Plan: Papida scan if no better with H2  Blocker( as a prelude to Surgical referral) D/C ASA

## 2011-02-03 NOTE — Telephone Encounter (Signed)
Left detailed msg on voicemail to [facility] services.

## 2011-02-03 NOTE — Patient Instructions (Signed)
Stop aspirin.The triggers for dyspepsia or "heart burn"  include stress; the "aspirin family" ; alcohol; peppermint; and caffeine (coffee, tea, cola, and chocolate). The aspirin family would include aspirin and the nonsteroidal agents such as ibuprofen &  Naproxen. Tylenol would not cause reflux. If having dyspepsia ; food & dink should be avoided for @ least 2 hors before going to bed.

## 2011-02-04 ENCOUNTER — Telehealth: Payer: Self-pay | Admitting: Internal Medicine

## 2011-02-04 MED ORDER — RANITIDINE HCL 150 MG PO TABS: 150.0000 mg | ORAL_TABLET | Freq: Two times a day (BID) | ORAL | Status: DC

## 2011-02-04 NOTE — Progress Notes (Signed)
Addended by: Edgardo Roys on: 02/04/2011 09:32 AM   Modules accepted: Orders

## 2011-02-04 NOTE — Telephone Encounter (Signed)
error 

## 2011-02-11 ENCOUNTER — Encounter: Payer: Self-pay | Admitting: Internal Medicine

## 2011-02-11 ENCOUNTER — Ambulatory Visit (INDEPENDENT_AMBULATORY_CARE_PROVIDER_SITE_OTHER): Payer: Medicare Other | Admitting: Internal Medicine

## 2011-02-11 VITALS — BP 118/70 | HR 48 | Temp 98.6°F | Wt 116.0 lb

## 2011-02-11 DIAGNOSIS — K5900 Constipation, unspecified: Secondary | ICD-10-CM

## 2011-02-11 DIAGNOSIS — R112 Nausea with vomiting, unspecified: Secondary | ICD-10-CM

## 2011-02-11 DIAGNOSIS — K8020 Calculus of gallbladder without cholecystitis without obstruction: Secondary | ICD-10-CM

## 2011-02-11 DIAGNOSIS — R001 Bradycardia, unspecified: Secondary | ICD-10-CM

## 2011-02-11 DIAGNOSIS — I498 Other specified cardiac arrhythmias: Secondary | ICD-10-CM

## 2011-02-11 DIAGNOSIS — R6889 Other general symptoms and signs: Secondary | ICD-10-CM

## 2011-02-11 DIAGNOSIS — E871 Hypo-osmolality and hyponatremia: Secondary | ICD-10-CM

## 2011-02-11 DIAGNOSIS — I10 Essential (primary) hypertension: Secondary | ICD-10-CM

## 2011-02-11 MED ORDER — METOPROLOL TARTRATE 25 MG PO TABS: 25.0000 mg | ORAL_TABLET | Freq: Two times a day (BID) | ORAL | Status: DC

## 2011-02-11 MED ORDER — METOPROLOL TARTRATE 25 MG PO TABS: ORAL_TABLET | ORAL | Status: DC

## 2011-02-11 MED ORDER — POLYETHYLENE GLYCOL 3350 17 GM/SCOOP PO POWD: ORAL | Status: DC

## 2011-02-11 NOTE — Progress Notes (Signed)
Addended by: Doristine Devoid on: 02/11/2011 04:21 PM   Modules accepted: Orders

## 2011-02-11 NOTE — Progress Notes (Signed)
  Subjective:    Patient ID: [name], female    DOB: 16-Aug-1914, 77 y.o.   MRN: [id number]  HPI N& V have resolved off supplements & ASA.  BP controlled off HCTZ. New issue includes rash. Location: antecubital areas  Onset:  2 weeks ago   Course: stable Self-treated with: no              History Pruritis: yes, minimally  Tenderness: no  New medications/antibiotics: no  Tick/insect/pet exposure: no  New detergent, new clothing, or other topical exposure: no  Diet change: strawberries before rash onset  Red Flags Feeling ill: no  Fever: no  Mouth lesions: no  Facial/tongue swelling/difficulty breathing:  no  PMH: no Psoriasis or Eczema    Review of Systems cold intolerance w/o fatigue or skin,nail or hair changes.She takes Miralax daily.     Objective:   Physical Exam Gen.: Healthy and well-nourished in appearance. Alert, appropriate and cooperative throughout exam. Appears younger than age Eyes: No corneal or conjunctival inflammation noted. Ptosis OD. No icterus. Mouth: Oral mucosa and oropharynx reveal no lesions, erythema, or exudates.  Neck: No deformities, masses, or tenderness noted.  Thyroid normal. Lungs: Normal respiratory effort; chest expands symmetrically. Lungs are clear to auscultation without rales, wheezes, or increased work of breathing. Heart: Slow rate and regular  rhythm. Normal S1 and S2. No gallop, click, or rub. Grade 1/2 -1 systolic  murmur. Abdomen: Bowel sounds normal; abdomen soft and nontender even to percussion over RUQ. No masses, organomegaly or hernias noted. Neurologic: Alert and oriented x3. Deep tendon reflexes symmetrical and normal.          Skin: thickened plaque-like dermatitis antecubital areas. Lymph: No cervical, axillary, or inguinal lymphadenopathy present. Psych: Mood and affect are normal. Normally interactive                                                                                         Assessment & Plan:    #1 nausea and vomiting, resolved off aspirin and supplements  #2 gallstone, asymptomatic  #3 dermatitis in the antecubital areas, possibly related to strawberries  #4 cold intolerance  #5 constipation  #6 bradycardia  Plan: Topical steroids twice a day for the rash and   food diary to identify any triggers  Thyroid function tests  MiraLax every third day as needed.  Decrease the beta blocker because of the bradycardia.

## 2011-02-11 NOTE — Patient Instructions (Addendum)
Decrease Metoprolol from 50 mg 1/2 twice a day to 25 mg 1/2 twice a day. See Plan .The triggers for dyspepsia or "heart burn"  include stress; the "aspirin family" ; alcohol; peppermint; and caffeine (coffee, tea, cola, and chocolate). The aspirin family would include aspirin and the nonsteroidal agents such as ibuprofen &  Naproxen. Tylenol would not cause reflux. If having dyspepsia ; food & dink should be avoided for @ least 2 hors before going to bed.

## 2011-02-11 NOTE — Progress Notes (Signed)
Addended by: Doristine Devoid on: 02/11/2011 04:11 PM   Modules accepted: Orders

## 2011-02-24 NOTE — Discharge Summary (Signed)
[name], [name] NO.:  [id number]  MEDICAL RECORD NO.:  [id number]  LOCATION:  1303                         FACILITY:  [facility]  PHYSICIAN:  Brendia Sacks, MD    DATE OF BIRTH:  Sep 22, 1913  DATE OF ADMISSION:  01/29/2011 DATE OF DISCHARGE:  01/31/2011                              DISCHARGE SUMMARY   PRIMARY CARE PHYSICIAN:  Sandria Bales. Alwyn Ren, MD  CONDITION ON DISCHARGE:  Improved.  DISPOSITION:  Home with home health physical and occupational therapy.  DISCHARGE DIAGNOSES: 1. Acute on chronic nausea and vomiting, resolved, presumably     secondary to cholelithiasis. 2. Probable symptomatic cholelithiasis without evidence of acute     cholecystitis.  Currently asymptomatic. 3. Constipation, possibly playing a role in her nausea and vomiting. 4. Chronic mild hyponatremia probably secondary to diuretic use. 5. History of coronary artery disease, hypertension and transient     ischemic attack, stable.  HISTORY OF PRESENT ILLNESS:  This is a 77 year old woman who presented to the emergency room with nausea and poor oral intake.  The patient had reported intermittent nausea and vomiting for years and she correlates it somewhat with aspirin.  However, last week, she had had persistent nausea and decreased oral intake.  She presented to the emergency room on September 28, 2010, and given IV fluids and discharged home.  She had an appointment with her primary care physician on the day of admission but given her nausea and vomiting, it was recommended she come to the emergency room for further evaluation.  She had no associated abdominal pain.  HOSPITAL COURSE: 1. Acute on chronic nausea and vomiting.  Since her hospitalization,     she has had no nausea or vomiting.  She is eating a full diet     without difficulty.  She has no abdominal pain, would like to go     home today.  Her evaluation was notable for cholelithiasis.  Given     her acute on chronic  history, I suspect symptomatic cholelithiasis.     It may be also that aspirin causes some nausea and vomiting as well     although she has had none while aspirin has been continued here.     Given her lack of fever, lack of abdominal pain and normal hepatic     function panel, there is no indication at this time to pursue     inpatient surgical evaluation.  I have recommended outpatient     surgical evaluation, however, for consideration of cholecystectomy.     I have offered to arrange this referral, however, the patient would     prefer to follow up with Dr. Alwyn Ren in the outpatient setting and     discuss this with him. 2. Constipation.  Abdominal films did show moderate amount of stool.     Constipation may play a role with her nausea and vomiting.  I have     recommended a bowel regimen. 3. Chronic mild hyponatremia.  Reviewing her laboratory data available     here in the hospital record reveals mildly low sodium dating back     to May of 2011.  Most  recently when she was in the ER on January 27, 2011, it was 127 and on discharge is 128.  She appears to be     asymptomatic.  I suspect this is diuretic related; therefore, I     have held her diuretic at this time.  Consider further evaluation     in the outpatient setting if this does not resolve with cessation     of diuretic therapy.  As she is asymptomatic, no further inpatient     workup is indicated at this time. 4. History of coronary artery disease, hypertension and transient     ischemic attack.  This appears to be stable.  CONSULTATIONS:  None.  PROCEDURES:  None.  IMAGING: 1. Acute abdominal series on January 29, 2011:  No acute abdominal     abnormalities apart from mild gaseous distention of the stomach.     Cholelithiasis.  Stable hyperinflation consistent with COPD or     asthma.  No acute cardiopulmonary disease. 2. Abdominal ultrasound on January 30, 2011:  Gallstones without other     sonographic evidence for  acute cholecystitis.  Mild infrarenal     distal abdominal aortic ectasia measuring 2.1 cm.  PERTINENT LABORATORY STUDIES: 1. Serum lipase within normal limits. 2. CBC, minimal leukocytosis of 13.3 on admission with resolution of     9.3 on discharge with supportive care.  Hemoglobin 10.6, appears to     be stable. 3. Normal platelet count. 4. Basic metabolic panel notable for sodium ranging from 125 to 129. 5. BUN and creatinine within normal limits. 6. Hepatic function panel within normal limits. 7. Cardiac markers negative. 8. Urinalysis equivocal.  PHYSICAL EXAMINATION ON DISCHARGE:  GENERAL:  The patient is feeling well.  She has had no nausea or vomiting.  She is eating well.  She has no abdominal pain. VITAL SIGNS:  Temperature is 98.1, pulse 56, respirations 15, blood pressure 132/79, sats 98% on room air.  Review of her records demonstrate a recorded pulse of 130 and 155, however, I do believe this is an error in recording, this is not reported and there is no record in the progress note of tachycardia, I believe this is an error. CARDIOVASCULAR:  Regular rate and rhythm.  No murmur, rub, or gallop. RESPIRATORY:  Clear to auscultation bilaterally.  No wheezes, rales or rhonchi. ABDOMEN:  Soft, nontender, nondistended.  There is no right upper quadrant pain with palpation. RESPIRATORY:  Respiratory effort is grossly normal.  DISCHARGE INSTRUCTIONS:  The patient will be discharged home today.  DIET:  Heart-healthy bland diet.  ACTIVITY:  Rolling walker.  Increase activity slowly.  Outpatient consultations to home health for home health physical therapy and occupational therapy.  DISCHARGE MEDICATIONS: 1. Colace 100 mg p.o. b.i.d. 2. Polyethylene glycol 17 g p.o. daily. 3. Senna 2 tablets p.o. b.i.d. as needed for constipation. 4. Valsartan 160 mg p.o. daily. 5. Aspirin 81 mg p.o. daily. 6. Azopt one drop in both eyes b.i.d. 7. Fish oil over-the-counter p.o.  daily. 8. Lumigan 1 drop in both eyes daily. 9. Metoprolol 25 mg p.o. b.i.d. 10.Multivitamin p.o. daily. 11.Vitamin C over-the-counter p.o. daily. 12.Zofran 8 mg 1 tablet every 6 hours as needed for nausea or     vomiting.  Discontinue the following medications:  Combination tablet of Diovan hydrochlorothiazide.  See rationale above.  Things to follow up in the outpatient setting: 1. Consider outpatient surgical evaluation for symptomatic     cholelithiasis. 2. Consider  discontinuing aspirin if the patient has recurrent nausea.  I did discuss the patient's hospitalization here, workup and disposition with the patient's son at the bedside.  All questions answered to their satisfaction.  He is agreeable to the plan and follow up with Dr. Alwyn Ren.  Time coordinating discharge is 25 minutes.     Brendia Sacks, MD     DG/MEDQ  D:  01/31/2011  T:  01/31/2011  Job:  [id number]  cc:   Peyton Najjar, MD Fax: [phone]  Electronically Signed by Brendia Sacks  on 02/24/2011 05:58:22 PM

## 2011-02-26 ENCOUNTER — Other Ambulatory Visit: Payer: Self-pay | Admitting: Internal Medicine

## 2011-02-26 DIAGNOSIS — R1013 Epigastric pain: Secondary | ICD-10-CM

## 2011-02-26 DIAGNOSIS — R112 Nausea with vomiting, unspecified: Secondary | ICD-10-CM

## 2011-02-26 MED ORDER — RANITIDINE HCL 150 MG PO TABS: 150.0000 mg | ORAL_TABLET | Freq: Two times a day (BID) | ORAL | Status: DC

## 2011-02-26 MED ORDER — VALSARTAN 160 MG PO TABS: 160.0000 mg | ORAL_TABLET | Freq: Every day | ORAL | Status: DC

## 2011-02-26 MED ORDER — METOPROLOL TARTRATE 25 MG PO TABS: ORAL_TABLET | ORAL | Status: DC

## 2011-02-26 NOTE — Telephone Encounter (Signed)
Patient was given 30 day supply zantac - diovan - lopressor -she said meds are working wants 90 day supply - [facility]

## 2011-06-17 ENCOUNTER — Other Ambulatory Visit: Payer: Self-pay | Admitting: Internal Medicine

## 2011-06-17 DIAGNOSIS — G459 Transient cerebral ischemic attack, unspecified: Secondary | ICD-10-CM

## 2011-06-22 ENCOUNTER — Ambulatory Visit
Admission: RE | Admit: 2011-06-22 | Discharge: 2011-06-22 | Disposition: A | Discharge status: Home / Self Care | Payer: Medicare Other | Source: Ambulatory Visit | Attending: Internal Medicine | Admitting: Internal Medicine

## 2011-06-22 DIAGNOSIS — G459 Transient cerebral ischemic attack, unspecified: Secondary | ICD-10-CM

## 2011-06-23 ENCOUNTER — Emergency Department (HOSPITAL_COMMUNITY): Payer: Medicare Other

## 2011-06-23 ENCOUNTER — Encounter (HOSPITAL_COMMUNITY): Payer: Self-pay | Admitting: Emergency Medicine

## 2011-06-23 ENCOUNTER — Inpatient Hospital Stay (HOSPITAL_COMMUNITY)
Admission: EM | Admit: 2011-06-23 | Discharge: 2011-06-28 | DRG: 292 | Disposition: A | Discharge status: Skilled Nursing Facility | Payer: Medicare Other | Attending: Internal Medicine | Admitting: Internal Medicine

## 2011-06-23 DIAGNOSIS — M1990 Unspecified osteoarthritis, unspecified site: Secondary | ICD-10-CM | POA: Diagnosis present

## 2011-06-23 DIAGNOSIS — I6529 Occlusion and stenosis of unspecified carotid artery: Secondary | ICD-10-CM | POA: Diagnosis present

## 2011-06-23 DIAGNOSIS — R7309 Other abnormal glucose: Secondary | ICD-10-CM | POA: Diagnosis present

## 2011-06-23 DIAGNOSIS — E871 Hypo-osmolality and hyponatremia: Secondary | ICD-10-CM | POA: Diagnosis present

## 2011-06-23 DIAGNOSIS — E785 Hyperlipidemia, unspecified: Secondary | ICD-10-CM | POA: Diagnosis present

## 2011-06-23 DIAGNOSIS — H9190 Unspecified hearing loss, unspecified ear: Secondary | ICD-10-CM | POA: Diagnosis present

## 2011-06-23 DIAGNOSIS — I10 Essential (primary) hypertension: Secondary | ICD-10-CM | POA: Diagnosis present

## 2011-06-23 DIAGNOSIS — I5033 Acute on chronic diastolic (congestive) heart failure: Secondary | ICD-10-CM | POA: Diagnosis present

## 2011-06-23 DIAGNOSIS — I509 Heart failure, unspecified: Secondary | ICD-10-CM | POA: Diagnosis present

## 2011-06-23 DIAGNOSIS — I2510 Atherosclerotic heart disease of native coronary artery without angina pectoris: Secondary | ICD-10-CM | POA: Diagnosis present

## 2011-06-23 DIAGNOSIS — D649 Anemia, unspecified: Secondary | ICD-10-CM | POA: Diagnosis present

## 2011-06-23 DIAGNOSIS — Z9849 Cataract extraction status, unspecified eye: Secondary | ICD-10-CM

## 2011-06-23 DIAGNOSIS — I252 Old myocardial infarction: Secondary | ICD-10-CM

## 2011-06-23 DIAGNOSIS — H3530 Unspecified macular degeneration: Secondary | ICD-10-CM | POA: Diagnosis present

## 2011-06-23 DIAGNOSIS — Z9861 Coronary angioplasty status: Secondary | ICD-10-CM

## 2011-06-23 DIAGNOSIS — Z85828 Personal history of other malignant neoplasm of skin: Secondary | ICD-10-CM

## 2011-06-23 DIAGNOSIS — H409 Unspecified glaucoma: Secondary | ICD-10-CM | POA: Diagnosis present

## 2011-06-23 HISTORY — DX: Shortness of breath: R06.02

## 2011-06-23 HISTORY — DX: Acute myocardial infarction, unspecified: I21.9

## 2011-06-23 LAB — BASIC METABOLIC PANEL
BUN: 14 mg/dL (ref 6–23)
Chloride: 93 mEq/L — ABNORMAL LOW (ref 96–112)
Creatinine, Ser: 0.71 mg/dL (ref 0.50–1.10)
GFR calc Af Amer: 82 mL/min — ABNORMAL LOW (ref >90)
GFR calc non Af Amer: 71 mL/min — ABNORMAL LOW (ref >90)
Potassium: 3.7 mEq/L (ref 3.5–5.1)

## 2011-06-23 LAB — URINALYSIS, ROUTINE W REFLEX MICROSCOPIC
Bilirubin Urine: NEGATIVE
Ketones, ur: NEGATIVE mg/dL
Leukocytes, UA: NEGATIVE
Nitrite: NEGATIVE
Specific Gravity, Urine: 1.009 (ref 1.005–1.030)
Urobilinogen, UA: 1 mg/dL (ref 0.0–1.0)

## 2011-06-23 LAB — DIFFERENTIAL
Basophils Absolute: 0 10*3/uL (ref 0.0–0.1)
Basophils Relative: 0 % (ref 0–1)
Eosinophils Absolute: 0.2 10*3/uL (ref 0.0–0.7)
Monocytes Relative: 10 % (ref 3–12)
Neutro Abs: 10.1 10*3/uL — ABNORMAL HIGH (ref 1.7–7.7)
Neutrophils Relative %: 80 % — ABNORMAL HIGH (ref 43–77)

## 2011-06-23 LAB — CBC
Hemoglobin: 8.1 g/dL — ABNORMAL LOW (ref 12.0–15.0)
MCH: 24 pg — ABNORMAL LOW (ref 26.0–34.0)
MCHC: 30.7 g/dL (ref 30.0–36.0)
RDW: 15.8 % — ABNORMAL HIGH (ref 11.5–15.5)

## 2011-06-23 MED ORDER — NITROGLYCERIN IN D5W 200-5 MCG/ML-% IV SOLN
5.0000 ug/min | INTRAVENOUS | Status: DC
  Administered 2011-06-23: 5 ug/min via INTRAVENOUS
  Filled 2011-06-23: qty 250

## 2011-06-23 MED ORDER — ALBUTEROL SULFATE (5 MG/ML) 0.5% IN NEBU
5.0000 mg | INHALATION_SOLUTION | Freq: Once | RESPIRATORY_TRACT | Status: AC
  Administered 2011-06-23: 5 mg via RESPIRATORY_TRACT
  Filled 2011-06-23: qty 1

## 2011-06-23 MED ORDER — IPRATROPIUM BROMIDE 0.02 % IN SOLN
0.5000 mg | Freq: Once | RESPIRATORY_TRACT | Status: AC
  Administered 2011-06-23: 0.5 mg via RESPIRATORY_TRACT
  Filled 2011-06-23: qty 2.5

## 2011-06-23 MED ORDER — FUROSEMIDE 10 MG/ML IJ SOLN
40.0000 mg | Freq: Once | INTRAMUSCULAR | Status: AC
  Administered 2011-06-23: 40 mg via INTRAVENOUS
  Filled 2011-06-23: qty 4

## 2011-06-23 NOTE — ED Notes (Signed)
Pt had lab work drawn yesterday morning. Elevated d dimer. Pt denies chest pain or sob. Pt has swelling to right lower extremity. Pms intact. Pt moving all extremities. Pt states "my whole body hurts and its been since labor day."

## 2011-06-23 NOTE — ED Notes (Signed)
Pt audibly wheezing after walking to restroom. Pt doesn't appear sob resps easy unlabored. Skin pwd. ER doc notified of labs, bp and wheezing. Orders received.

## 2011-06-24 ENCOUNTER — Inpatient Hospital Stay (HOSPITAL_COMMUNITY): Payer: Medicare Other

## 2011-06-24 ENCOUNTER — Encounter (HOSPITAL_COMMUNITY): Payer: Self-pay | Admitting: Internal Medicine

## 2011-06-24 DIAGNOSIS — D649 Anemia, unspecified: Secondary | ICD-10-CM | POA: Diagnosis present

## 2011-06-24 DIAGNOSIS — I5033 Acute on chronic diastolic (congestive) heart failure: Secondary | ICD-10-CM | POA: Diagnosis present

## 2011-06-24 DIAGNOSIS — I359 Nonrheumatic aortic valve disorder, unspecified: Secondary | ICD-10-CM

## 2011-06-24 DIAGNOSIS — E871 Hypo-osmolality and hyponatremia: Secondary | ICD-10-CM | POA: Diagnosis present

## 2011-06-24 HISTORY — DX: Acute on chronic diastolic (congestive) heart failure: I50.33

## 2011-06-24 LAB — CBC
HCT: 29.8 % — ABNORMAL LOW (ref 36.0–46.0)
MCH: 24.3 pg — ABNORMAL LOW (ref 26.0–34.0)
MCV: 78 fL (ref 78.0–100.0)
RBC: 3.82 MIL/uL — ABNORMAL LOW (ref 3.87–5.11)
RDW: 15.8 % — ABNORMAL HIGH (ref 11.5–15.5)
WBC: 18.2 10*3/uL — ABNORMAL HIGH (ref 4.0–10.5)

## 2011-06-24 LAB — VITAMIN B12: Vitamin B-12: 316 pg/mL (ref 211–911)

## 2011-06-24 LAB — DIFFERENTIAL
Lymphocytes Relative: 5 % — ABNORMAL LOW (ref 12–46)
Lymphs Abs: 0.9 10*3/uL (ref 0.7–4.0)
Neutrophils Relative %: 86 % — ABNORMAL HIGH (ref 43–77)

## 2011-06-24 LAB — CARDIAC PANEL(CRET KIN+CKTOT+MB+TROPI)
Relative Index: INVALID (ref 0.0–2.5)
Relative Index: INVALID (ref 0.0–2.5)
Total CK: 14 U/L (ref 7–177)
Troponin I: <0.3 ng/mL (ref <0.30)
Troponin I: <0.3 ng/mL (ref <0.30)

## 2011-06-24 LAB — RETICULOCYTES
RBC.: 3.8 MIL/uL — ABNORMAL LOW (ref 3.87–5.11)
Retic Ct Pct: 2.2 % (ref 0.4–3.1)

## 2011-06-24 LAB — FOLATE: Folate: 18 ng/mL

## 2011-06-24 LAB — MRSA PCR SCREENING: MRSA by PCR: NEGATIVE

## 2011-06-24 LAB — CREATININE, SERUM: GFR calc Af Amer: 83 mL/min — ABNORMAL LOW (ref >90)

## 2011-06-24 LAB — IRON AND TIBC
Saturation Ratios: 8 % — ABNORMAL LOW (ref 20–55)
TIBC: 208 ug/dL — ABNORMAL LOW (ref 250–470)
UIBC: 191 ug/dL (ref 125–400)

## 2011-06-24 LAB — PRO B NATRIURETIC PEPTIDE: Pro B Natriuretic peptide (BNP): 4690 pg/mL — ABNORMAL HIGH (ref 0–450)

## 2011-06-24 MED ORDER — ENOXAPARIN SODIUM 40 MG/0.4ML ~~LOC~~ SOLN
40.0000 mg | Freq: Every day | SUBCUTANEOUS | Status: DC
  Administered 2011-06-24 – 2011-06-28 (×5): 40 mg via SUBCUTANEOUS
  Filled 2011-06-24 (×5): qty 0.4

## 2011-06-24 MED ORDER — ONDANSETRON HCL 4 MG PO TABS: 4.0000 mg | ORAL_TABLET | Freq: Four times a day (QID) | ORAL | Status: DC | PRN

## 2011-06-24 MED ORDER — ONDANSETRON HCL 4 MG/2ML IJ SOLN: 4.0000 mg | Freq: Four times a day (QID) | INTRAMUSCULAR | Status: DC | PRN

## 2011-06-24 MED ORDER — ACETAMINOPHEN 325 MG PO TABS
650.0000 mg | ORAL_TABLET | Freq: Four times a day (QID) | ORAL | Status: DC | PRN
  Filled 2011-06-24: qty 2

## 2011-06-24 MED ORDER — HYDRALAZINE HCL 20 MG/ML IJ SOLN: 10.0000 mg | Freq: Four times a day (QID) | INTRAMUSCULAR | Status: DC | PRN

## 2011-06-24 MED ORDER — SODIUM CHLORIDE 0.9 % IJ SOLN
3.0000 mL | Freq: Two times a day (BID) | INTRAMUSCULAR | Status: DC
  Administered 2011-06-24 – 2011-06-28 (×10): 3 mL via INTRAVENOUS

## 2011-06-24 MED ORDER — METOPROLOL TARTRATE 25 MG PO TABS
25.0000 mg | ORAL_TABLET | Freq: Two times a day (BID) | ORAL | Status: DC
  Administered 2011-06-24 – 2011-06-25 (×3): 25 mg via ORAL
  Filled 2011-06-24 (×4): qty 1

## 2011-06-24 MED ORDER — FERROUS SULFATE 325 (65 FE) MG PO TABS
325.0000 mg | ORAL_TABLET | Freq: Two times a day (BID) | ORAL | Status: DC
  Administered 2011-06-24 – 2011-06-28 (×9): 325 mg via ORAL
  Filled 2011-06-24 (×11): qty 1

## 2011-06-24 MED ORDER — BRINZOLAMIDE 1 % OP SUSP
1.0000 [drp] | Freq: Two times a day (BID) | OPHTHALMIC | Status: DC
  Filled 2011-06-24: qty 10

## 2011-06-24 MED ORDER — DORZOLAMIDE HCL 2 % OP SOLN
1.0000 [drp] | Freq: Two times a day (BID) | OPHTHALMIC | Status: DC
  Administered 2011-06-24 – 2011-06-28 (×9): 1 [drp] via OPHTHALMIC
  Filled 2011-06-24: qty 10

## 2011-06-24 MED ORDER — FUROSEMIDE 10 MG/ML IJ SOLN
40.0000 mg | Freq: Two times a day (BID) | INTRAMUSCULAR | Status: DC
  Administered 2011-06-24 – 2011-06-26 (×5): 40 mg via INTRAVENOUS
  Filled 2011-06-24 (×8): qty 4

## 2011-06-24 MED ORDER — DULOXETINE HCL 30 MG PO CPEP
30.0000 mg | ORAL_CAPSULE | Freq: Every day | ORAL | Status: DC
  Administered 2011-06-24 – 2011-06-28 (×5): 30 mg via ORAL
  Filled 2011-06-24 (×5): qty 1

## 2011-06-24 MED ORDER — BIMATOPROST 0.03 % OP SOLN
1.0000 [drp] | Freq: Every day | OPHTHALMIC | Status: DC
  Administered 2011-06-24 – 2011-06-27 (×4): 1 [drp] via OPHTHALMIC
  Filled 2011-06-24: qty 2.5

## 2011-06-24 MED ORDER — IOHEXOL 350 MG/ML SOLN
100.0000 mL | Freq: Once | INTRAVENOUS | Status: AC | PRN
  Administered 2011-06-24: 100 mL via INTRAVENOUS

## 2011-06-24 MED ORDER — POLYETHYLENE GLYCOL 3350 17 GM/SCOOP PO POWD: 17.0000 g | Freq: Every day | ORAL | Status: DC | PRN

## 2011-06-24 MED ORDER — DOCUSATE SODIUM 100 MG PO CAPS
100.0000 mg | ORAL_CAPSULE | Freq: Two times a day (BID) | ORAL | Status: DC
  Administered 2011-06-24 – 2011-06-28 (×9): 100 mg via ORAL
  Filled 2011-06-24 (×11): qty 1

## 2011-06-24 MED ORDER — OLMESARTAN MEDOXOMIL 20 MG PO TABS
20.0000 mg | ORAL_TABLET | Freq: Every day | ORAL | Status: DC
  Administered 2011-06-24 – 2011-06-26 (×3): 20 mg via ORAL
  Filled 2011-06-24 (×3): qty 1

## 2011-06-24 MED ORDER — ACETAMINOPHEN 325 MG PO TABS
650.0000 mg | ORAL_TABLET | Freq: Every day | ORAL | Status: DC
  Administered 2011-06-24 – 2011-06-25 (×2): 650 mg via ORAL
  Administered 2011-06-26: 325 mg via ORAL
  Administered 2011-06-27 – 2011-06-28 (×2): 650 mg via ORAL
  Filled 2011-06-24 (×4): qty 2

## 2011-06-24 NOTE — ED Provider Notes (Signed)
History     CSN: [id number] Arrival date & time: 06/23/2011  7:46 PM   First MD Initiated Contact with Patient 06/23/11 1951      Chief Complaint  Patient presents with  . Abnormal Lab    elevated d dimer drawn at 06/22/2011 0740    (Consider location/radiation/quality/duration/timing/severity/associated sxs/prior treatment) The history is provided by the patient and a relative.   the patient was recently admitted to an assisted living facility.  Part of her admission.  Evaluation included a battery of tests.  Her d-dimer was elevated, so she was sent to the emergency department for evaluation of this abnormal test.  The patient specifically denies chest pain or shortness of breath.  She has not had a cough, fevers, leg pain or swelling.  She has not had recent trip or surgery.  In general, she is an ambulatory.  Person.  She does report that she has dyspnea on exertion.  However, she denies chest pain, when she ambulates.  Past Medical History  Diagnosis Date  . Osteoarthritis   . Dyslipidemia   . Carotid stenosis   . Hypertension   . CAD (coronary artery disease)   . Glaucoma   . Hearing loss   . Carcinoma     basal cell  . Cataract   . Anemia   . Macular degeneration   . Other abnormal glucose 2003    Past Surgical History  Procedure Date  . Cataract extraction   . Tonsillectomy   . Non-stemi 2001    w/ a successful PTCA  . Stent of rca     unsuccessful PTCA of left circumflex  . Adenoidectomy     Family History  Problem Relation Age of Onset  . Coronary artery disease    . Stroke    . Breast cancer Daughter   . Heart attack      History  Substance Use Topics  . Smoking status: Never Smoker   . Smokeless tobacco: Not on file  . Alcohol Use: Yes     rare     OB History    Grav Para Term Preterm Abortions TAB SAB Ect Mult Living                  Review of Systems  Constitutional: Negative for fever and chills.  HENT: Negative for congestion.     Eyes: Negative for redness.  Respiratory: Negative for cough, chest tightness and shortness of breath.   Cardiovascular: Negative for chest pain.       Dyspnea on exertion  Gastrointestinal: Negative for nausea, vomiting and abdominal pain.  Genitourinary: Negative for dysuria.  Skin: Negative for color change and rash.  Neurological: Negative for weakness and headaches.  Psychiatric/Behavioral: Negative for confusion.    Allergies  Aspirin; Atorvastatin; Clopidogrel bisulfate; Flexeril; and Ramipril  Home Medications   Current Outpatient Rx  Name Route Sig Dispense Refill  . ACETAMINOPHEN 325 MG PO TABS Oral Take 650 mg by mouth every 6 (six) hours as needed. For mild aches.     . ACETAMINOPHEN 325 MG PO TABS Oral Take 650 mg by mouth daily.      Marland Kitchen VITAMIN C 500 MG PO TABS Oral Take 500 mg by mouth daily.      Marland Kitchen BIMATOPROST 0.03 % OP SOLN Both Eyes Place 1 drop into both eyes at bedtime.     Marland Kitchen BRINZOLAMIDE 1 % OP SUSP Both Eyes Place 1 drop into both eyes 2 (two) times daily.     Marland Kitchen  DOCUSATE SODIUM 100 MG PO CAPS Oral Take 100 mg by mouth 2 (two) times daily.      . DULOXETINE HCL 30 MG PO CPEP Oral Take 30 mg by mouth daily.      Marland Kitchen FERROUS SULFATE 325 (65 FE) MG PO TABS Oral Take 325 mg by mouth 2 (two) times daily.      . FUROSEMIDE 20 MG PO TABS Oral Take 10 mg by mouth daily.      Marland Kitchen METOPROLOL TARTRATE 25 MG PO TABS Oral Take 25 mg by mouth 2 (two) times daily.      . CERTAVITE/ANTIOXIDANTS PO Oral Take 1 tablet by mouth daily. Certavite-anitoxidant 18 mg-0.4 mg     . ONDANSETRON HCL 8 MG PO TABS Oral Take 8 mg by mouth every 6 (six) hours as needed. For nausea/vomiting     . POLYETHYLENE GLYCOL 3350 PO POWD Oral Take 17 g by mouth daily as needed.      Marland Kitchen VALSARTAN 160 MG PO TABS Oral Take 160 mg by mouth daily.        BP 181/55  Pulse 91  Temp(Src) 98 F (36.7 C) (Oral)  Resp 18  SpO2 97%  Physical Exam  Constitutional: She is oriented to person, place, and time. She  appears well-developed and well-nourished.  HENT:  Head: Normocephalic and atraumatic.  Eyes: Pupils are equal, round, and reactive to light.  Neck: Normal range of motion.  Cardiovascular: Normal rate, regular rhythm and normal heart sounds.   No murmur heard. Pulmonary/Chest: Effort normal and breath sounds normal. No respiratory distress. She has no wheezes. She has no rales.  Abdominal: Soft. She exhibits no distension and no mass. There is no tenderness. There is no rebound and no guarding.  Musculoskeletal: Normal range of motion. She exhibits edema. She exhibits no tenderness.       1+ bilateral lower extremity swelling, which is symmetric.  No signs of infection  Neurological: She is alert and oriented to person, place, and time. No cranial nerve deficit.  Skin: Skin is warm and dry. No rash noted. No erythema.  Psychiatric: She has a normal mood and affect. Her behavior is normal.    ED Course  Procedures (including critical care time)  77 year old female was sent to the emergency department because of an abnormal d-dimer.  She specifically denies chest pain, and shortness of breath at rest.  She is not had recent travel or surgery and she is an ambulatory patient.  I do not think that she has had a pulmonary embolism, and does not have signs of a DVT.  Laboratory tests will be with needed in the emergency department and we will determine her disposition.  Based upon the test results, and the patient condition  Labs Reviewed  CBC - Abnormal; Notable for the following:    WBC 12.6 (*)    RBC 3.37 (*)    Hemoglobin 8.1 (*)    HCT 26.4 (*)    MCH 24.0 (*)    RDW 15.8 (*)    Platelets 427 (*)    All other components within normal limits  DIFFERENTIAL - Abnormal; Notable for the following:    Neutrophils Relative 80 (*)    Neutro Abs 10.1 (*)    Lymphocytes Relative 8 (*)    Monocytes Absolute 1.3 (*)    All other components within normal limits  BASIC METABOLIC PANEL -  Abnormal; Notable for the following:    Sodium 127 (*)  Chloride 93 (*)    Glucose, Bld 150 (*)    GFR calc non Af Amer 71 (*)    GFR calc Af Amer 82 (*)    All other components within normal limits  URINALYSIS, ROUTINE W REFLEX MICROSCOPIC - Abnormal; Notable for the following:    Appearance HAZY (*)    Hgb urine dipstick SMALL (*)    All other components within normal limits  D-DIMER, QUANTITATIVE - Abnormal; Notable for the following:    D-Dimer, Quant 1.64 (*)    All other components within normal limits  URINE MICROSCOPIC-ADD ON   US Carotid Duplex Bilateral  06/22/2011  *RADIOLOGY REPORT*  Clinical Data: History of TIA; history of hypertension and CAD  BILATERAL CAROTID DUPLEX ULTRASOUND  Technique: Gray scale imaging, color Doppler and duplex ultrasound was performed of bilateral carotid and vertebral arteries in the neck.  Comparison:  Brain MRI - 12/26/2009  Criteria:  Quantification of carotid stenosis is based on velocity parameters that correlate the residual internal carotid diameter with NASCET-based stenosis levels, using the diameter of the distal internal carotid lumen as the denominator for stenosis measurement.  The following velocity measurements were obtained:                   PEAK SYSTOLIC/END DIASTOLIC RIGHT ICA:                        277/33cm/sec CCA:                        139/14cm/sec SYSTOLIC ICA/CCA RATIO:     10 DIASTOLIC ICA/CCA RATIO:    2.39 ECA:                        187cm/sec  LEFT ICA:                        483/65cm/sec CCA:                        107/12cm/sec SYSTOLIC ICA/CCA RATIO:     4.5 DIASTOLIC ICA/CCA RATIO:    5.5 ECA:                        133cm/sec  Findings:  RIGHT CAROTID ARTERY: There is a moderate to large amount of eccentric shadowing atherosclerotic plaque within the right carotid bulb and proximal aspect of the right internal carotid artery. These findings are associated with elevated peak systolic velocity within the proximal and mid  aspect of the internal carotid artery (peak systolic velocity - 277 cm/sec) compatible with a greater than 70% luminal narrowing).  RIGHT VERTEBRAL ARTERY:  Preserved antegrade flow  LEFT CAROTID ARTERY: There is a moderate to large amount of eccentric shadowing atherosclerotic plaque within the left carotid bulb and proximal aspect of the left internal carotid artery. These findings are associated with elevated peak systolic velocity within the proximal and mid aspect of the internal carotid artery (peak systolic velocity - 483 cm/sec, compatible with greater than 70% luminal narrowing.  LEFT VERTEBRAL ARTERY:  Not visualized, as was demonstrated on prior MRI  Incidental note is made of a 1 cm anechoic lesion within the posterior aspect of the right lobe of the thyroid which demonstrates increased through transmission however minimal mural nodularity.  IMPRESSION: 1.  Moderate to large amount of atherosclerotic plaque within  the bilateral carotid bulbs and proximal aspects of the bilateral internal carotid arteries resulting in elevated peak systolic velocities compatible with greater than 70% luminal narrowing bilaterally, left greater than right. Comparison for interval change is difficult between different modalities.  2.  Nonvisualization of the left vertebral artery. 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  3. Incidental note made of a 1 cm minimally complex right-sided thyroid cyst.  Original Report Authenticated By: Waynard Reeds, M.D.   Dg Chest Portable 1 View  06/23/2011  *RADIOLOGY REPORT*  Clinical Data: Short of breath.  Cough.  Myocardial infarction.  PORTABLE CHEST - 1 VIEW  Comparison: 01/27/2011  Findings: Mild cardiomegaly.  Bibasilar opacity.  Small bilateral effusions.  Kerley B lines at the bases.  No pneumothorax.  IMPRESSION: CHF with mild basilar edema and small pleural effusions.  Original Report Authenticated By: Donavan Burnet, M.D.      No diagnosis found.  12:13 AM Discussed with the hospitalist.  He will admit the patient to the hospital to  MDM  Congestive heart failure Dyspnea on exertion. The patient has no chest pain, endorsed, shortness of breath at rest.  She does not have unilateral leg swelling.  I do not believe that she has DVT or pulmonary embolism.        Nicholes Stairs, MD 06/24/11 [phone]

## 2011-06-24 NOTE — Progress Notes (Signed)
Clinical Social Worker completed psychosocial assessment and placed in shadow chart.  Clinical Social Worker to complete FL-2 and place on shadow chart. Clinical Social Worker to facilitate pt D/C needs when pt medically ready for D/C.   Jacklynn Lewis, MSW, LCSWA  Clinical Social Work [phone]

## 2011-06-24 NOTE — ED Notes (Signed)
DNR bracelet placed on pt's right ue

## 2011-06-24 NOTE — Progress Notes (Signed)
Physical Therapy Evaluation Patient Details Name: [name] MRN: [id number] DOB: 01-20-1914 Today's Date: 06/24/2011  Problem List:  Patient Active Problem List  Diagnoses  . CARCINOMA, BASAL CELL  . GLUCOSE INTOLERANCE  . HEARING LOSS  . HYPERTENSION  . CAROTID STENOSIS  . OSTEOARTHRITIS  . CATARACT, HX OF  . GLAUCOMA, HX OF  . DYSLIPIDEMIA  . CORONARY ARTERY DISEASE  . Diastolic CHF, acute on chronic  . HTN (hypertension), malignant  . Anemia  . Hyponatremia    Past Medical History:  Past Medical History  Diagnosis Date  . Osteoarthritis   . Dyslipidemia   . Carotid stenosis   . Hypertension   . CAD (coronary artery disease)   . Glaucoma   . Hearing loss   . Carcinoma     basal cell  . Cataract   . Anemia   . Macular degeneration   . Other abnormal glucose 2003  . Shortness of breath   . Myocardial infarction    Past Surgical History:  Past Surgical History  Procedure Date  . Cataract extraction   . Tonsillectomy   . Non-stemi 2001    w/ a successful PTCA  . Stent of rca     unsuccessful PTCA of left circumflex  . Adenoidectomy   . Tonsillectomy   . Eye surgery     cataracts B eyes    PT Assessment/Plan/Recommendation PT Assessment Clinical Impression Statement: Pt is 77 y/o frail appearing female who reports ongoing weakness and one prior fall since moving to ALF approx. 1 month ago. Pt would definitely benefit further therapy services to address gait, balance and generalized strengthening.  PT Recommendation/Assessment: Patient will need skilled PT in the acute care venue PT Problem List: Decreased strength;Decreased knowledge of use of DME;Decreased activity tolerance;Decreased balance;Decreased mobility PT Therapy Diagnosis : Difficulty walking;Abnormality of gait;Generalized weakness PT Plan PT Frequency: Min 3X/week PT Treatment/Interventions: DME instruction;Gait training;Stair training;Functional mobility training;Therapeutic  exercise;Balance training;Neuromuscular re-education PT Recommendation Recommendations for Other Services: OT consult Follow Up Recommendations: Skilled nursing facility (short term skilled nursing) Equipment Recommended: Defer to next venue PT Goals  Acute Rehab PT Goals PT Goal Formulation: With patient Pt will go Supine/Side to Sit: with modified independence PT Goal: Supine/Side to Sit - Progress: Progressing toward goal Pt will Sit at [facility] of Bed: with modified independence PT Goal: Sit at [facility] Of Bed - Progress: Progressing toward goal Pt will go Sit to Supine/Side: with modified independence PT Goal: Sit to Supine/Side - Progress: Progressing toward goal Pt will Transfer Sit to Stand/Stand to Sit: with modified independence PT Transfer Goal: Sit to Stand/Stand to Sit - Progress: Progressing toward goal Pt will Transfer Bed to Chair/Chair to Bed: with modified independence PT Transfer Goal: Bed to Chair/Chair to Bed - Progress: Progressing toward goal Pt will Ambulate: >150 feet;with least restrictive assistive device;with modified independence PT Goal: Ambulate - Progress: Progressing toward goal Pt will Perform Home Exercise Program: Independently PT Goal: Perform Home Exercise Program - Progress: Progressing toward goal Additional Goals Additional Goal #1: Pt will demo decreased risk of falls with gait speed greater than or equal to 1.31 ft/sec PT Goal: Additional Goal #1 - Progress: Progressing toward goal  PT Evaluation Precautions/Restrictions  Precautions Precautions: Fall Restrictions Weight Bearing Restrictions: No Prior Functioning  Home Living Lives With: Alone Receives Help From:  (ALF staff) Type of Home: Assisted living Home Layout: One level Home Access: Level entry Bathroom Shower/Tub: Health visitor: Handicapped height Bathroom Accessibility: Yes  How Accessible: Accessible via walker;Accessible via wheelchair Home Adaptive Equipment:  Walker - rolling;Grab bars in shower;Grab bars around toilet Additional Comments: Reports she has been needing more help now Prior Function Level of Independence: Requires assistive device for independence;Needs assistance with ADLs Bath: Minimal Dressing: Minimal Grooming: Minimal Driving: No Comments:  (pt reports things have been getting harder for her to do) Cognition Cognition Arousal/Alertness: Awake/alert Overall Cognitive Status: Appears within functional limits for tasks assessed Sensation/Coordination Sensation Light Touch: Appears Intact Coordination Gross Motor Movements are Fluid and Coordinated: Yes Extremity Assessment RUE Strength RUE Overall Strength Comments: Grossly 4/5  LUE Strength LUE Overall Strength Comments: grossly 4/5 RLE Strength RLE Overall Strength Comments: pt with generalized weakness grossly 4/5, reports she has been getting weaker the past few weeks; pt with decreased activity tolerance, slows down as she walks further LLE Strength LLE Overall Strength Comments: see RLE Mobility (including Balance) Bed Mobility Bed Mobility: Yes Supine to Sit: 4: Min assist;HOB elevated (Comment degrees) (30) Supine to Sit Details (indicate cue type and reason): pt using rail and pulling on therapist to sit upright  Sitting - Scoot to Edge of Bed: 4: Min assist Sitting - Scoot to [employer] of Bed Details (indicate cue type and reason): cueing for sequencing and use of pad to assist with scooting Transfers Transfers: Yes Sit to Stand: 4: Min assist;With upper extremity assist;From elevated surface;From bed Sit to Stand Details (indicate cue type and reason): minA facilitation for follow through, increased time to stand  Stand to Sit: 5: Supervision Stand to Sit Details: cueing for techniqe and controlled descent Ambulation/Gait Ambulation/Gait: Yes Ambulation/Gait Assistance: 4: Min assist Ambulation/Gait Assistance Details (indicate cue type and reason): Pt amb  with flexed posture and use of RW, decreased speed  Ambulation Distance (Feet): 80 Feet Assistive device: Rolling walker Gait velocity: .32 ft/sec    Exercise  Total Joint Exercises Ankle Circles/Pumps: AROM;Strengthening;Right;Left;10 reps;Seated End of Session PT - End of Session Equipment Utilized During Treatment: Gait belt Activity Tolerance: Patient tolerated treatment well;Patient limited by fatigue Patient left: in chair Nurse Communication: Mobility status for transfers;Mobility status for ambulation General Behavior During Session: [facility] for tasks performed Cognition: [facility] for tasks performed  [facility] HELEN 06/24/2011, 3:49 PM

## 2011-06-24 NOTE — Progress Notes (Signed)
Subjective: Feels unchanged compared to last night.  Objective: Vital signs in last 24 hours: Temp:  [98 F (36.7 C)-98.6 F (37 C)] 98.4 F (36.9 C) (11/08 1610) Pulse Rate:  [69-91] 80  (11/08 1000) Resp:  [15-19] 18  (11/08 0642) BP: (135-207)/(49-73) 135/51 mmHg (11/08 1003) SpO2:  [93 %-100 %] 99 % (11/08 0642) Weight:  [59.557 kg (131 lb 4.8 oz)] 131 lb 4.8 oz (59.557 kg) (11/08 0300) Weight change:  Body mass index is 24.01 kg/(m^2).  Intake/Output from previous day: 11/07 0701 - 11/08 0700 In: -  Out: 1700 [Urine:1700]   PHYSICAL EXAM: Gen Exam: Awake and alert with clear speech.   Neck: Supple, No JVD.   Chest: B/L Clear except for a few by basilar rales. CVS: S1 S2 Regular, no murmurs.  Abdomen: soft, BS +, non tender, non distended.  Extremities: 3+ pitting edema bilaterally, warm.   Neurologic: Non Focal.   Skin: No Rash.   Wounds: N/A.    Lab Results:  Basename 06/24/11 0650 06/23/11 2027  WBC 18.2* 12.6*  HGB 9.3* 8.1*  HCT 29.8* 26.4*  PLT 474* 427*   CMET CMP     Component Value Date/Time   NA 127* 06/23/2011 2027   K 3.7 06/23/2011 2027   CL 93* 06/23/2011 2027   CO2 29 06/23/2011 2027   GLUCOSE 150* 06/23/2011 2027   BUN 14 06/23/2011 2027   CREATININE 0.69 06/24/2011 0650   CALCIUM 8.8 06/23/2011 2027   PROT 7.3 01/29/2011 1504   ALBUMIN 3.7 01/29/2011 1504   AST 25 01/29/2011 1504   ALT 14 01/29/2011 1504   ALKPHOS 83 01/29/2011 1504   BILITOT 0.7 01/29/2011 1504   GFRNONAA 72* 06/24/2011 0650   GFRAA 83* 06/24/2011 0650    LIPIDS @LASTLIPID @ @LASTBNP @ COAGS No results found for this basename: PT:2,INR:2 in the last 72 hours  Studies/Results: Ct Angio Chest W/cm &/or Wo Cm  06/24/2011  *RADIOLOGY REPORT*  Clinical Data:  Dyspnea, elevated D-dimer.  Lower extremity swelling.  CT ANGIOGRAPHY CHEST WITH CONTRAST  Technique:  Multidetector CT imaging of the chest was performed using the standard protocol during bolus administration of  intravenous contrast.  Multiplanar CT image reconstructions including MIPs were obtained to evaluate the vascular anatomy.  Contrast: OMNIPAQUE IOHEXOL 350 MG/ML IV SOLN  Comparison:  06/23/2011 radiograph  Findings:  No pulmonary arterial filling defect identified.  Mild tortuosity and ectasia of the thoracic aorta with advanced areas of atherosclerotic disease.  Heart size is within upper normal limits. Coronary artery and aortic valve calcification noted.  Mitral valve calcification present.  No intrathoracic lymphadenopathy.  Moderate bilateral pleural effusions.  No pericardial effusion.  Limited images through the upper abdomen show no acute abnormality.  Parenchymal evaluation is significantly degraded by respiratory motion.  Within this limitation, there is suggestion of interlobular septal prominence and bibasilar opacities.  No pneumothorax.  Multilevel degenerative changes of the imaged spine. No acute or aggressive appearing osseous lesion.  Review of the MIP images confirms the above findings.  IMPRESSION:  CHF/pulmonary edema pattern with interlobular septal thickening and moderate bilateral pleural effusions.  Associated bibasilar opacities likely reflect atelectasis.  No pulmonary embolism identified.  Original Report Authenticated By: Waneta Martins, M.D.   Dg Chest Portable 1 View  06/23/2011  *RADIOLOGY REPORT*  Clinical Data: Short of breath.  Cough.  Myocardial infarction.  PORTABLE CHEST - 1 VIEW  Comparison: 01/27/2011  Findings: Mild cardiomegaly.  Bibasilar opacity.  Small bilateral effusions.  Kerley B lines at the bases.  No pneumothorax.  IMPRESSION: CHF with mild basilar edema and small pleural effusions.  Original Report Authenticated By: Donavan Burnet, M.D.    Medications:  Scheduled:   . acetaminophen  650 mg Oral Daily  . albuterol  5 mg Nebulization Once  . bimatoprost  1 drop Both Eyes QHS  . docusate sodium  100 mg Oral BID  . dorzolamide  1 drop Both Eyes  BID  . DULoxetine  30 mg Oral Daily  . enoxaparin  40 mg Subcutaneous Daily  . ferrous sulfate  325 mg Oral BID  . furosemide  40 mg Intravenous Once  . furosemide  40 mg Intravenous Q12H  . ipratropium  0.5 mg Nebulization Once  . metoprolol tartrate  25 mg Oral BID  . olmesartan  20 mg Oral Daily  . sodium chloride  3 mL Intravenous Q12H  . DISCONTD: brinzolamide  1 drop Both Eyes BID    Assessment/Plan: Principal Problem:  *Diastolic CHF, acute on chronic -Patient has significant edema and has gained around 10 pounds the past month or so. We will continue her on IV Lasix, check daily weights and do strict I&O is. -Await 2-D echocardiogram.  Active Problems:  HTN (hypertension), malignant -Blood pressure better controlled. -Continue with Benicar and Lopressor.   Anemia -Seems to be a chronic issue, we'll obtain a anemia panel. Continue with ferrous sulfate.  Hyponatremia -This again seems to be a chronic issue, I don't know if this is SIADH from her CHF or reset thermostat. -I will monitor this, since this seems to be a chronic issue I would not order any further workup unless this sodium level drops remarkably.  Coronary artery disease -Stable, cardiac enzymes negative.  Carotid stenosis -Outpatient monitoring, if at all.  CODE STATUS -DO NOT RESUSCITATE, reconfirmed with patient and son at bedside.  Disposition -Remain inpatient.   Maretta Bees, MD. 06/24/2011, 1:42 PM

## 2011-06-24 NOTE — Progress Notes (Signed)
  Echocardiogram 2D Echocardiogram has been performed.  [name] 06/24/2011, 4:40 PM

## 2011-06-24 NOTE — H&P (Addendum)
PCP:   Murray Hodgkins, M.D.  Primary cardiologist: Dr. Tonny Bollman, [facility] cardiology.  Chief Complaint:  Dyspnea.  HPI: Patient is a very pleasant 77 year old Caucasian female patient with history of hypertension, CAD status post PCI in the early 1990s, bilateral carotid artery stenosis, who [facility] the [facility] for the last 19-1/2 years. Patient indicates that she traveled to [facility] (3-4 hours drive one way) during the Labor Day weekend. A few days after returning from that trip she started complaining of dyspnea on exertion. This seems to have progressively gotten worse. She says this is more at nights. Even getting up from the bed to the bathroom and back makes her tired and dyspneic. She denies dyspnea at rest, orthopnea or chest pains. She does complain of swelling of both legs. She has minimal dry cough and says that she might have had a fever once or twice in the in the past weeks. She was evaluated at the facility and was found to have abnormal labs including elevated d-dimer, ESR, rheumatoid factor, ANA and BNP. The transfer note from the facility indicated that she was being sent to the emergency room for evaluation of alleviated d-dimer. Since being in the emergency room, the emergency room physician has started her on a nitroglycerin drip for markedly elevated blood pressures and has given a dose of Lasix for presumed congestive heart failure. Patient denies any significant change. She complains of generalized achiness.  Past Medical History: Past Medical History  Diagnosis Date  . Osteoarthritis   . Dyslipidemia   . Carotid stenosis   . Hypertension   . CAD (coronary artery disease)   . Glaucoma   . Hearing loss   . Carcinoma     basal cell  . Cataract   . Anemia   . Macular degeneration   . Other abnormal glucose 2003  . Shortness of breath   . Myocardial infarction     Past Surgical History: Past Surgical History  Procedure  Date  . Cataract extraction   . Tonsillectomy   . Non-stemi 2001    w/ a successful PTCA  . Stent of rca     unsuccessful PTCA of left circumflex  . Adenoidectomy   . Eye surgery   . Tonsillectomy     Allergies:   Allergies  Allergen Reactions  . Aspirin Nausea Only  . Atorvastatin Nausea Only  . Benazepril     Not known  . Clopidogrel Bisulfate   . Flexeril (Cyclobenzaprine Hcl)   . Guanfacine     Not known.  Marland Kitchen Hctz (Hydrochlorothiazide)     Not known  . Ramipril     REACTION: cough    Medications: Prior to Admission medications   Medication Sig Start Date End Date Taking? Authorizing Provider  acetaminophen (TYLENOL) 325 MG tablet Take 650 mg by mouth every 6 (six) hours as needed. For mild aches.    Yes Historical Provider, MD  acetaminophen (TYLENOL) 325 MG tablet Take 650 mg by mouth daily.     Yes Historical Provider, MD  Ascorbic Acid (VITAMIN C) 500 MG tablet Take 500 mg by mouth daily.     Yes Historical Provider, MD  bimatoprost (LUMIGAN) 0.03 % ophthalmic drops Place 1 drop into both eyes at bedtime.    Yes Historical Provider, MD  brinzolamide (AZOPT) 1 % ophthalmic suspension Place 1 drop into both eyes 2 (two) times daily.    Yes Historical Provider, MD  docusate sodium (  COLACE) 100 MG capsule Take 100 mg by mouth 2 (two) times daily.     Yes Historical Provider, MD  DULoxetine (CYMBALTA) 30 MG capsule Take 30 mg by mouth daily.     Yes Historical Provider, MD  ferrous sulfate 325 (65 FE) MG tablet Take 325 mg by mouth 2 (two) times daily.     Yes Historical Provider, MD  furosemide (LASIX) 20 MG tablet Take 10 mg by mouth daily.     Yes Historical Provider, MD  metoprolol tartrate (LOPRESSOR) 25 MG tablet Take 25 mg by mouth 2 (two) times daily.     Yes Historical Provider, MD  Multiple Vitamins-Minerals (CERTAVITE/ANTIOXIDANTS PO) Take 1 tablet by mouth daily. Certavite-anitoxidant 18 mg-0.4 mg    Yes Historical Provider, MD  ondansetron (ZOFRAN) 8 MG  tablet Take 8 mg by mouth every 6 (six) hours as needed. For nausea/vomiting    Yes Historical Provider, MD  polyethylene glycol powder (GLYCOLAX/MIRALAX) powder Take 17 g by mouth daily as needed.     Yes Historical Provider, MD  valsartan (DIOVAN) 160 MG tablet Take 160 mg by mouth daily.     Yes Historical Provider, MD    Family History: Family History  Problem Relation Age of Onset  . Coronary artery disease    . Breast cancer Daughter   . Stroke Mother   . Heart attack Father     Social History:  reports that she has never smoked. She does not have any smokeless tobacco history on file. She reports that she does not drink alcohol or use illicit drugs. she is widowed. She says that she ambulates independently. 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  Advanced directives: DO NOT RESUSCITATE.  Review of Systems:  All systems reviewed and apart from history of presenting illness is pertinent for joint pains and generalized achiness. Patient denies history of bleeding or melena. No history of nausea vomiting diarrhea or abnormal pain. No history of dysuria or urinary frequency. No history of headache earache or sore throat.   Physical Exam: Filed Vitals:   06/23/11 2205 06/23/11 2300 06/24/11 0000 06/24/11 0015  BP: 189/60 165/49 181/55 164/50  Pulse: 75 82 91 78  Temp:      TempSrc:      Resp: 18     SpO2: 97% 93% 97% 100%   Gen. exam: Patient is a moderately built and nourished female patient who looks much younger than her stated age. She is lying comfortably supine in bed with no obvious distress. Head eyes and ENT: Nontraumatic and normocephalic. Pupils equally reacting to light and accommodation. Bilateral intraocular lens present. Oral mucosa moist. Neck: Supple. Bilateral carotid bruit's right greater than left. Lymphatics: No lymphadenopathy Respiratory system: Reduced breath sounds in the bases with few basal crackles. Rest  of the lung fields are clear. No increased work of breathing. Cardiovascular system: First and second heart sounds heard, regular. No JVD appreciated. 3+ pitting edema from toes to the thighs. No asymmetrical swelling. Gastrointestinal system: Abdomen is nondistended, soft and nontender. Normal bowel sounds heard. No organomegaly or masses appreciated. Central nervous system: Patient is awake alert oriented x3. No focal neurological deficits. Extremities: With edema as indicated above. Grade 5 x 5 power symmetrically. Difficult to appreciate peripheral pulses in the legs secondary to edema. Musculoskeletal system: No acute findings.  Labs on Admission:   [facility] 06/23/11 2027  NA 127*  K 3.7  CL 93*  CO2 29  GLUCOSE 150*  BUN 14  CREATININE 0.71  CALCIUM 8.8  MG --  PHOS --   No results found for this basename: AST:2,ALT:2,ALKPHOS:2,BILITOT:2,PROT:2,ALBUMIN:2 in the last 72 hours No results found for this basename: LIPASE:2,AMYLASE:2 in the last 72 hours  Basename 06/23/11 2027  WBC 12.6*  NEUTROABS 10.1*  HGB 8.1*  HCT 26.4*  MCV 78.3  PLT 427*   No results found for this basename: CKTOTAL:3,CKMB:3,CKMBINDEX:3,TROPONINI:3 in the last 72 hours No results found for this basename: TSH,T4TOTAL,FREET3,T3FREE,THYROIDAB in the last 72 hours No results found for this basename: VITAMINB12:2,FOLATE:2,FERRITIN:2,TIBC:2,IRON:2,RETICCTPCT:2 in the last 72 hours  Radiological Exams on Admission: US Carotid Duplex Bilateral  06/22/2011  *RADIOLOGY REPORT*  Clinical Data: History of TIA; history of hypertension and CAD  BILATERAL CAROTID DUPLEX ULTRASOUND  Technique: Gray scale imaging, color Doppler and duplex ultrasound was performed of bilateral carotid and vertebral arteries in the neck.  Comparison:  Brain MRI - 12/26/2009  Criteria:  Quantification of carotid stenosis is based on velocity parameters that correlate the residual internal carotid diameter with NASCET-based stenosis levels,  using the diameter of the distal internal carotid lumen as the denominator for stenosis measurement.  The following velocity measurements were obtained:                   PEAK SYSTOLIC/END DIASTOLIC RIGHT ICA:                        277/33cm/sec CCA:                        139/14cm/sec SYSTOLIC ICA/CCA RATIO:     10 DIASTOLIC ICA/CCA RATIO:    2.39 ECA:                        187cm/sec  LEFT ICA:                        483/65cm/sec CCA:                        107/12cm/sec SYSTOLIC ICA/CCA RATIO:     4.5 DIASTOLIC ICA/CCA RATIO:    5.5 ECA:                        133cm/sec  Findings:  RIGHT CAROTID ARTERY: There is a moderate to large amount of eccentric shadowing atherosclerotic plaque within the right carotid bulb and proximal aspect of the right internal carotid artery. These findings are associated with elevated peak systolic velocity within the proximal and mid aspect of the internal carotid artery (peak systolic velocity - 277 cm/sec) compatible with a greater than 70% luminal narrowing).  RIGHT VERTEBRAL ARTERY:  Preserved antegrade flow  LEFT CAROTID ARTERY: There is a moderate to large amount of eccentric shadowing atherosclerotic plaque within the left carotid bulb and proximal aspect of the left internal carotid artery. These findings are associated with elevated peak systolic velocity within the proximal and mid aspect of the internal carotid artery (peak systolic velocity - 483 cm/sec, compatible with greater than 70% luminal narrowing.  LEFT VERTEBRAL ARTERY:  Not visualized, as was demonstrated on prior MRI  Incidental note is made of a 1 cm anechoic lesion within the posterior aspect of the right lobe of the thyroid which demonstrates increased through transmission however minimal mural nodularity.  IMPRESSION: 1.  Moderate  to large amount of atherosclerotic plaque within the bilateral carotid bulbs and proximal aspects of the bilateral internal carotid arteries resulting in elevated peak systolic  velocities compatible with greater than 70% luminal narrowing bilaterally, left greater than right. Comparison for interval change is difficult between different modalities.  2.  Nonvisualization of the left vertebral artery. 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  3. Incidental note made of a 1 cm minimally complex right-sided thyroid cyst.  Original Report Authenticated By: Waynard Reeds, M.D.   Dg Chest Portable 1 View  06/23/2011  *RADIOLOGY REPORT*  Clinical Data: Short of breath.  Cough.  Myocardial infarction.  PORTABLE CHEST - 1 VIEW  Comparison: 01/27/2011  Findings: Mild cardiomegaly.  Bibasilar opacity.  Small bilateral effusions.  Kerley B lines at the bases.  No pneumothorax.  IMPRESSION: CHF with mild basilar edema and small pleural effusions.  Original Report Authenticated By: Donavan Burnet, M.D.      Assessment/Plan Present on Admission:   1. Diastolic CHF, acute on chronic: Her presentation is quite suggestive of acute on chronic diastolic congestive heart failure probably worsened by uncontrolled hypertension. However given her history of recent travel, will rule out acute pulmonary embolism. Patient will be admitted to telemetry. Cycle cardiac enzymes and obtain CT angiogram of chest. Will request EKG and 2-D echocardiogram without contrast. Treat with IV Lasix and taper to discontinue IV nitroglycerin. Continue home dose of the Toprol and angiotensin receptor blocker. Monitor renal functions closely. 2. Malignant hypertension: Resume her home medications including metoprolol and angiotensin receptor blocker. Continue IV Lasix and taper to discontinue IV nitroglycerin. 3. Anemia: Probably of chronic disease. Will follow daily CBCs. 4. Coronary artery disease history: Management as indicated above. Will hold aspirin and Plavix secondary to her documented allergies. 5. Hyponatremia: Unclear etiology. Question SIADH. Follow daily  BMPs. 6. CODE STATUS: DO NOT RESUSCITATE.  7. Abnormal connective tissue lab work: Unclear etiology. Consider outpatient evaluation as deemed necessary.  Darnella Zeiter 06/24/2011, 1:06 AM

## 2011-06-25 LAB — CBC
Hemoglobin: 9 g/dL — ABNORMAL LOW (ref 12.0–15.0)
MCH: 24.3 pg — ABNORMAL LOW (ref 26.0–34.0)
MCHC: 31 g/dL (ref 30.0–36.0)
MCV: 78.2 fL (ref 78.0–100.0)
RBC: 3.71 MIL/uL — ABNORMAL LOW (ref 3.87–5.11)

## 2011-06-25 LAB — BASIC METABOLIC PANEL
BUN: 14 mg/dL (ref 6–23)
CO2: 30 mEq/L (ref 19–32)
Calcium: 8.7 mg/dL (ref 8.4–10.5)
Chloride: 94 mEq/L — ABNORMAL LOW (ref 96–112)
Creatinine, Ser: 0.78 mg/dL (ref 0.50–1.10)

## 2011-06-25 LAB — PRO B NATRIURETIC PEPTIDE: Pro B Natriuretic peptide (BNP): 2896 pg/mL — ABNORMAL HIGH (ref 0–450)

## 2011-06-25 MED ORDER — METOPROLOL TARTRATE 50 MG PO TABS
50.0000 mg | ORAL_TABLET | Freq: Two times a day (BID) | ORAL | Status: DC
  Administered 2011-06-25 – 2011-06-28 (×6): 50 mg via ORAL
  Filled 2011-06-25 (×7): qty 1

## 2011-06-25 NOTE — Progress Notes (Signed)
Clinical Social Worker reviewed chart. PT recommending SNF for short term rehab before returning back to ALF. Clinical Social Worker met with pt to discuss recommendations. Pt agreeable to SNF search in [facility]. Pt from [facility] and Clinical Social Worker spoke with [facility] who cannot guarantee pt a bed in SNF. Pt was informed of this and pt agreeable to other SNF options. Clinical Social Worker contacted pt son and left message. Clinical Social Worker to follow-up with pt in regard to bed offers. Clinical Social Worker to facilitate pt D/C needs when pt medically ready for D/C.

## 2011-06-25 NOTE — Progress Notes (Signed)
Subjective: Feels better, less shortness of breath.  Objective: Vital signs in last 24 hours: Temp:  [98.2 F (36.8 C)-99.1 F (37.3 C)] 99.1 F (37.3 C) (11/09 0650) Pulse Rate:  [56-71] 69  (11/09 0650) Resp:  [18-20] 20  (11/09 0650) BP: (139-177)/(61-69) 150/61 mmHg (11/09 0650) SpO2:  [93 %-99 %] 93 % (11/09 0650) Weight:  [56.8 kg (125 lb 3.5 oz)] 125 lb 3.5 oz (56.8 kg) (11/09 0654) Weight change: -2.757 kg (-6 lb 1.3 oz) Body mass index is 22.90 kg/(m^2).  Intake/Output from previous day: 11/08 0701 - 11/09 0700 In: 663 [P.O.:660; I.V.:3] Out: 3200 [Urine:3200]   PHYSICAL EXAM: Gen Exam: Awake and alert with clear speech.  Very pleasant. Neck: Supple, No JVD.   Chest: B/L Clear except for a few by basilar rales. CVS: S1 S2 Regular, no murmurs.  Abdomen: soft, BS +, non tender, non distended.  Extremities: 2+ pitting edema bilaterally, warm.   Neurologic: Non Focal.   Skin: No Rash.   Wounds: N/A.    Lab Results:  Basename 06/25/11 0610 06/24/11 0650  WBC 12.6* 18.2*  HGB 9.0* 9.3*  HCT 29.0* 29.8*  PLT 431* 474*   CMET CMP     Component Value Date/Time   NA 132* 06/25/2011 0610   K 3.5 06/25/2011 0610   CL 94* 06/25/2011 0610   CO2 30 06/25/2011 0610   GLUCOSE 104* 06/25/2011 0610   BUN 14 06/25/2011 0610   CREATININE 0.78 06/25/2011 0610   CALCIUM 8.7 06/25/2011 0610   PROT 7.3 01/29/2011 1504   ALBUMIN 3.7 01/29/2011 1504   AST 25 01/29/2011 1504   ALT 14 01/29/2011 1504   ALKPHOS 83 01/29/2011 1504   BILITOT 0.7 01/29/2011 1504   GFRNONAA 69* 06/25/2011 0610   GFRAA 80* 06/25/2011 0610    LIPIDS @LASTLIPID @ @LASTBNP @ COAGS No results found for this basename: PT:2,INR:2 in the last 72 hours  Studies/Results: Ct Angio Chest W/cm &/or Wo Cm  06/24/2011  *RADIOLOGY REPORT*  Clinical Data:  Dyspnea, elevated D-dimer.  Lower extremity swelling.  CT ANGIOGRAPHY CHEST WITH CONTRAST  Technique:  Multidetector CT imaging of the chest was performed using the  standard protocol during bolus administration of intravenous contrast.  Multiplanar CT image reconstructions including MIPs were obtained to evaluate the vascular anatomy.  Contrast: OMNIPAQUE IOHEXOL 350 MG/ML IV SOLN  Comparison:  06/23/2011 radiograph  Findings:  No pulmonary arterial filling defect identified.  Mild tortuosity and ectasia of the thoracic aorta with advanced areas of atherosclerotic disease.  Heart size is within upper normal limits. Coronary artery and aortic valve calcification noted.  Mitral valve calcification present.  No intrathoracic lymphadenopathy.  Moderate bilateral pleural effusions.  No pericardial effusion.  Limited images through the upper abdomen show no acute abnormality.  Parenchymal evaluation is significantly degraded by respiratory motion.  Within this limitation, there is suggestion of interlobular septal prominence and bibasilar opacities.  No pneumothorax.  Multilevel degenerative changes of the imaged spine. No acute or aggressive appearing osseous lesion.  Review of the MIP images confirms the above findings.  IMPRESSION:  CHF/pulmonary edema pattern with interlobular septal thickening and moderate bilateral pleural effusions.  Associated bibasilar opacities likely reflect atelectasis.  No pulmonary embolism identified.  Original Report Authenticated By: Waneta Martins, M.D.   Dg Chest Portable 1 View  06/23/2011  *RADIOLOGY REPORT*  Clinical Data: Short of breath.  Cough.  Myocardial infarction.  PORTABLE CHEST - 1 VIEW  Comparison: 01/27/2011  Findings: Mild cardiomegaly.  Bibasilar opacity.  Small bilateral effusions.  Kerley B lines at the bases.  No pneumothorax.  IMPRESSION: CHF with mild basilar edema and small pleural effusions.  Original Report Authenticated By: Donavan Burnet, M.D.    Medications:  Scheduled:    . acetaminophen  650 mg Oral Daily  . bimatoprost  1 drop Both Eyes QHS  . docusate sodium  100 mg Oral BID  . dorzolamide  1  drop Both Eyes BID  . DULoxetine  30 mg Oral Daily  . enoxaparin  40 mg Subcutaneous Daily  . ferrous sulfate  325 mg Oral BID  . furosemide  40 mg Intravenous Q12H  . metoprolol tartrate  50 mg Oral BID  . olmesartan  20 mg Oral Daily  . sodium chloride  3 mL Intravenous Q12H  . DISCONTD: metoprolol tartrate  25 mg Oral BID    Assessment/Plan: Principal Problem:  *Diastolic CHF, acute on chronic -She is doing well with IV Lasix, she is diuresing will well and has lost around 6 pounds. - We will continue her on IV Lasix, check daily weights and do strict I&O is. -2-D echocardiogram-reviewed  Active Problems:  HTN (hypertension), malignant -Blood pressure better controlled. -Continue with Benicar  but will increase Lopressor to 50 mg twice daily.   Anemia -Seems to be a chronic issue. Continue with ferrous sulfate. -Anemia panel revealed.  Hyponatremia -This again seems to be a chronic issue, I don't know if this is SIADH from her CHF or reset thermostat. -Sodium better with diuresis. This seems more likely to be from volume overload.  Coronary artery disease -Stable, cardiac enzymes negative.  Carotid stenosis -Outpatient monitoring, if at all.  CODE STATUS -DO NOT RESUSCITATE, reconfirmed with patient and son at bedside.  Disposition -Remain inpatient. PT however recommending skilled nursing facility at discharge. Currently not ready for discharge.   Maretta Bees, MD. 06/25/2011, 11:12 AM

## 2011-06-25 NOTE — Progress Notes (Signed)
Physical Therapy Treatment Patient Details Name: [name] MRN: [id number] DOB: [birth year] Today's Date: 06/25/2011  PT Assessment/Plan  PT - Assessment/Plan Comments on Treatment Session: Pt tolerated treatment session well.  Pt was able to increase ambulation distance, and tolerated standing c minguard A while brushing her teeth and hair.  Pt was excited to see PT when we walked in and said "I need to walk, walk, walk!" PT Plan: Discharge plan remains appropriate;Frequency remains appropriate PT Frequency: Min 3X/week Follow Up Recommendations: Skilled nursing facility Equipment Recommended: Defer to next venue PT Goals  Acute Rehab PT Goals PT Goal: Supine/Side to Sit - Progress: Progressing toward goal PT Goal: Sit at [facility] Of Bed - Progress: Progressing toward goal PT Goal: Sit to Supine/Side - Progress: Other (comment) (Not assessed) PT Transfer Goal: Sit to Stand/Stand to Sit - Progress: Progressing toward goal PT Transfer Goal: Bed to Chair/Chair to Bed - Progress: Other (comment) (not assessed) PT Goal: Ambulate - Progress: Progressing toward goal PT Goal: Perform Home Exercise Program - Progress: Other (comment) (not assessed)  PT Treatment Precautions/Restrictions  Precautions Precautions: Fall Restrictions Weight Bearing Restrictions: No Mobility (including Balance) Bed Mobility Bed Mobility: Yes Supine to Sit: 4: Min assist;HOB elevated (Comment degrees) Supine to Sit Details (indicate cue type and reason): pt used rail and therapists hand to pull self up.  (A) to complete movement. Sitting - Scoot to Edge of Bed: 5: Supervision Sitting - Scoot to [place] of Bed Details (indicate cue type and reason): verbal cues to initiate movement Transfers Transfers: Yes Sit to Stand: 5: Supervision;With upper extremity assist;From bed;From chair/3-in-1 Sit to Stand Details (indicate cue type and reason): Supervision for pt safety.   Stand to Sit: 5: Supervision;With upper  extremity assist;To chair/3-in-1 Stand to Sit Details: cuing for hand placement.  Supervision (A) to maintain patient safety. Ambulation/Gait Ambulation/Gait: Yes Ambulation/Gait Assistance: 4: Min assist Ambulation/Gait Assistance Details (indicate cue type and reason): Min (A) for pt balance and safety.  (A) to guide RW. Ambulation Distance (Feet): 100 Feet Assistive device: Rolling walker Gait Pattern: Shuffle;Trunk flexed Gait velocity: decreased Stairs: No Wheelchair Mobility Wheelchair Mobility: No  Posture/Postural Control Posture/Postural Control: No significant limitations Balance Balance Assessed: Yes Static Standing Balance Static Standing - Balance Support: Left upper extremity supported Static Standing - Level of Assistance: Other (comment) (mingaurd A for pt safety) Static Standing - Comment/# of Minutes: 5 minutes (Pt stood for ~ 5 min while she brushed her hair and teeth) Vitals: O2 Sats:  94% prior to ambulation  Exercise    End of Session PT - End of Session Equipment Utilized During Treatment: Gait belt;Other (comment) (RW) Activity Tolerance: Patient tolerated treatment well Patient left: in chair;with call bell in reach General Behavior During Session: [facility] for tasks performed Cognition: [facility] for tasks performed  Lacinda Axon 06/25/2011, 3:30 PM

## 2011-06-26 ENCOUNTER — Inpatient Hospital Stay (HOSPITAL_COMMUNITY): Payer: Medicare Other

## 2011-06-26 LAB — BASIC METABOLIC PANEL
BUN: 15 mg/dL (ref 6–23)
CO2: 30 mEq/L (ref 19–32)
Calcium: 8.5 mg/dL (ref 8.4–10.5)
Creatinine, Ser: 0.78 mg/dL (ref 0.50–1.10)
GFR calc non Af Amer: 69 mL/min — ABNORMAL LOW (ref >90)
Glucose, Bld: 106 mg/dL — ABNORMAL HIGH (ref 70–99)

## 2011-06-26 LAB — CBC
HCT: 27.7 % — ABNORMAL LOW (ref 36.0–46.0)
MCH: 24.4 pg — ABNORMAL LOW (ref 26.0–34.0)
MCHC: 31 g/dL (ref 30.0–36.0)
MCV: 78.5 fL (ref 78.0–100.0)
Platelets: 441 10*3/uL — ABNORMAL HIGH (ref 150–400)
RDW: 15.9 % — ABNORMAL HIGH (ref 11.5–15.5)

## 2011-06-26 MED ORDER — POTASSIUM CHLORIDE 20 MEQ PO PACK: 20.0000 meq | PACK | Freq: Every day | ORAL | Status: DC

## 2011-06-26 MED ORDER — OLMESARTAN MEDOXOMIL 40 MG PO TABS
40.0000 mg | ORAL_TABLET | Freq: Every day | ORAL | Status: DC
  Administered 2011-06-27 – 2011-06-28 (×2): 40 mg via ORAL
  Filled 2011-06-26 (×2): qty 1

## 2011-06-26 MED ORDER — POTASSIUM CHLORIDE CRYS ER 20 MEQ PO TBCR
20.0000 meq | EXTENDED_RELEASE_TABLET | Freq: Every day | ORAL | Status: DC
  Administered 2011-06-26 – 2011-06-28 (×3): 20 meq via ORAL
  Filled 2011-06-26 (×2): qty 1

## 2011-06-26 MED ORDER — FUROSEMIDE 10 MG/ML IJ SOLN
40.0000 mg | Freq: Every day | INTRAMUSCULAR | Status: DC
  Filled 2011-06-26: qty 4

## 2011-06-26 NOTE — Plan of Care (Signed)
Problem: Phase I Progression Outcomes Goal: EF % per last Echo/documented,Core Reminder form on chart Outcome: Completed/Met Date Met:  06/26/11 60-65%

## 2011-06-26 NOTE — Progress Notes (Signed)
Subjective: Feels better, less shortness of breath with exertion.  Objective: Vital signs in last 24 hours: Temp:  [97.9 F (36.6 C)-98.1 F (36.7 C)] 97.9 F (36.6 C) (11/10 0507) Pulse Rate:  [61-71] 63  (11/10 0507) Resp:  [18-20] 20  (11/10 0507) BP: (152-184)/(59-68) 184/68 mmHg (11/10 0507) SpO2:  [93 %-96 %] 96 % (11/10 0507) Weight:  [55.2 kg (121 lb 11.1 oz)] 121 lb 11.1 oz (55.2 kg) (11/10 0507) Weight change: -1.6 kg (-3 lb 8.4 oz) Body mass index is 22.26 kg/(m^2).  Intake/Output from previous day: 11/09 0701 - 11/10 0700 In: 600 [P.O.:600] Out: 3000 [Urine:3000]   PHYSICAL EXAM: Gen Exam: Awake and alert with clear speech.  Very pleasant. Neck: Supple, No JVD.   Chest: B/L Clear. CVS: S1 S2 Regular, no murmurs.  Abdomen: soft, BS +, non tender, non distended.  Extremities: 1+ pitting edema bilaterally, warm.   Neurologic: Non Focal.   Skin: No Rash.   Wounds: N/A.    Lab Results:  Basename 06/26/11 0500 06/25/11 0610  WBC 10.7* 12.6*  HGB 8.6* 9.0*  HCT 27.7* 29.0*  PLT 441* 431*   CMET CMP     Component Value Date/Time   NA 129* 06/26/2011 0500   K 3.3* 06/26/2011 0500   CL 90* 06/26/2011 0500   CO2 30 06/26/2011 0500   GLUCOSE 106* 06/26/2011 0500   BUN 15 06/26/2011 0500   CREATININE 0.78 06/26/2011 0500   CALCIUM 8.5 06/26/2011 0500   PROT 7.3 01/29/2011 1504   ALBUMIN 3.7 01/29/2011 1504   AST 25 01/29/2011 1504   ALT 14 01/29/2011 1504   ALKPHOS 83 01/29/2011 1504   BILITOT 0.7 01/29/2011 1504   GFRNONAA 69* 06/26/2011 0500   GFRAA 80* 06/26/2011 0500    LIPIDS @LASTLIPID @ @LASTBNP @ COAGS No results found for this basename: PT:2,INR:2 in the last 72 hours  Studies/Results: Dg Chest Portable 1 View  06/26/2011  *RADIOLOGY REPORT*  Clinical Data: SOB  PORTABLE CHEST - 1 VIEW  Comparison: 06/23/11  Findings: The heart size appears normal.  There are small bilateral pleural effusions and interstitial edema consistent with CHF.  This is  unchanged at from previous exam.  Atelectasis is noted within the lung bases.  IMPRESSION:  1.  CHF.  Unchanged from previous exam.  Original Report Authenticated By: Rosealee Albee, M.D.    Medications:  Scheduled:    . acetaminophen  650 mg Oral Daily  . bimatoprost  1 drop Both Eyes QHS  . docusate sodium  100 mg Oral BID  . dorzolamide  1 drop Both Eyes BID  . DULoxetine  30 mg Oral Daily  . enoxaparin  40 mg Subcutaneous Daily  . ferrous sulfate  325 mg Oral BID  . furosemide  40 mg Intravenous Daily  . metoprolol tartrate  50 mg Oral BID  . olmesartan  20 mg Oral Daily  . sodium chloride  3 mL Intravenous Q12H  . DISCONTD: furosemide  40 mg Intravenous Q12H    Assessment/Plan: Principal Problem:  *Diastolic CHF, acute on chronic -She is doing well with IV Lasix, she is diuresing will well and has lost around 6 pounds. - Change Lasix to daily dosing, continue with daily weights and do strict I&O is. -2-D echocardiogram-reviewed  Active Problems:  HTN (hypertension), malignant -Blood pressure not optimally controlled. -Increase Benicar to 40 mg, continue with Lopressor to 50 mg twice daily. -Reassess BP tomorrow   Anemia -Seems to be a chronic issue. Continue with  ferrous sulfate. -Anemia panel revealed.  Hyponatremia -This again seems to be a chronic issue.t. -Sodium better with diuresis. This seems more likely to be from volume overload. -Monitor periodically  Coronary artery disease -Stable, cardiac enzymes negative.  Carotid stenosis -Outpatient monitoring, if at all given age  CODE STATUS -DO NOT RESUSCITATE  Disposition -Remain inpatient. PT however recommending skilled nursing facility at discharge. Currently not ready for discharge.   Maretta Bees, MD. 06/26/2011, 11:37 AM

## 2011-06-26 NOTE — Progress Notes (Signed)
CSW provided b/o to Pt son.  CSW did not wake Pt and son verbalized appreciation.  Son to discuss offers with Pt and f/u with this MSW or weekday MSW.  Contact information provided.  B/O left in [place]. Milus Banister MSW,LCSW w/e Coverage [phone]

## 2011-06-27 MED ORDER — FUROSEMIDE 40 MG PO TABS
40.0000 mg | ORAL_TABLET | Freq: Every day | ORAL | Status: DC
  Administered 2011-06-27 – 2011-06-28 (×2): 40 mg via ORAL
  Filled 2011-06-27 (×3): qty 1

## 2011-06-27 NOTE — Progress Notes (Signed)
CSW spoke with Pt son, who accepted offer from [facility]. CSW informed facility via [facility].  Pt son to transport Pt to facility. Milus Banister MSW,LCSW w/e Coverage [phone]

## 2011-06-27 NOTE — Progress Notes (Signed)
Subjective: Continues to do well. Now almost back to her usual baseline  Objective: Vital signs in last 24 hours: Temp:  [97.7 F (36.5 C)-98.4 F (36.9 C)] 97.7 F (36.5 C) (11/11 0454) Pulse Rate:  [56-60] 58  (11/11 0454) Resp:  [18-20] 18  (11/11 0454) BP: (133-170)/(47-57) 160/57 mmHg (11/11 0454) SpO2:  [93 %-96 %] 93 % (11/11 0454) Weight:  [54.8 kg (120 lb 13 oz)] 120 lb 13 oz (54.8 kg) (11/11 0454) Weight change: -0.4 kg (-14.1 oz) Body mass index is 19.50 kg/(m^2).  Intake/Output from previous day: 11/10 0701 - 11/11 0700 In: 560 [P.O.:560] Out: 175 [Urine:175]   PHYSICAL EXAM: Gen Exam: Awake and alert with clear speech.   Neck: Supple, No JVD.   Chest: B/L Clear. CVS: S1 S2 Regular, no murmurs.  Abdomen: soft, BS +, non tender, non distended.  Extremities: Trace edema, warm.   Neurologic: Non Focal.   Skin: No Rash.   Wounds: N/A.    Lab Results:  Basename 06/26/11 0500 06/25/11 0610  WBC 10.7* 12.6*  HGB 8.6* 9.0*  HCT 27.7* 29.0*  PLT 441* 431*   CMET CMP     Component Value Date/Time   NA 129* 06/26/2011 0500   K 3.3* 06/26/2011 0500   CL 90* 06/26/2011 0500   CO2 30 06/26/2011 0500   GLUCOSE 106* 06/26/2011 0500   BUN 15 06/26/2011 0500   CREATININE 0.78 06/26/2011 0500   CALCIUM 8.5 06/26/2011 0500   PROT 7.3 01/29/2011 1504   ALBUMIN 3.7 01/29/2011 1504   AST 25 01/29/2011 1504   ALT 14 01/29/2011 1504   ALKPHOS 83 01/29/2011 1504   BILITOT 0.7 01/29/2011 1504   GFRNONAA 69* 06/26/2011 0500   GFRAA 80* 06/26/2011 0500    LIPIDS @LASTLIPID @ @LASTBNP @ COAGS No results found for this basename: PT:2,INR:2 in the last 72 hours  Studies/Results: Dg Chest Portable 1 View  06/26/2011  *RADIOLOGY REPORT*  Clinical Data: SOB  PORTABLE CHEST - 1 VIEW  Comparison: 06/23/11  Findings: The heart size appears normal.  There are small bilateral pleural effusions and interstitial edema consistent with CHF.  This is unchanged at from previous exam.   Atelectasis is noted within the lung bases.  IMPRESSION:  1.  CHF.  Unchanged from previous exam.  Original Report Authenticated By: Rosealee Albee, M.D.    Medications:  Scheduled:    . acetaminophen  650 mg Oral Daily  . bimatoprost  1 drop Both Eyes QHS  . docusate sodium  100 mg Oral BID  . dorzolamide  1 drop Both Eyes BID  . DULoxetine  30 mg Oral Daily  . enoxaparin  40 mg Subcutaneous Daily  . ferrous sulfate  325 mg Oral BID  . furosemide  40 mg Intravenous Daily  . metoprolol tartrate  50 mg Oral BID  . olmesartan  40 mg Oral Daily  . potassium chloride  20 mEq Oral Daily  . sodium chloride  3 mL Intravenous Q12H  . DISCONTD: olmesartan  20 mg Oral Daily  . DISCONTD: potassium chloride  20 mEq Oral Daily    Assessment/Plan: Principal Problem:  *Diastolic CHF, acute on chronic -Change Lasix to oral. -Continue with Lopressor and Benicar -2-D echocardiogram-reviewed  Active Problems:  HTN (hypertension), malignant -Blood pressure not optimally controlled. -Increase Benicar to 40 mg, continue with Lopressor to 50 mg twice daily. -Continue with her current antihypertensives and reassess tomorrow.   Anemia -Seems to be a chronic issue. Continue with ferrous sulfate. -  Anemia panel revealed.  Hyponatremia -This again seems to be a chronic issue. -Sodium better with diuresis. This seems more likely to be from volume overload. -Monitor periodically  Coronary artery disease -Stable, cardiac enzymes negative.  Carotid stenosis -Outpatient monitoring, if at all given age  CODE STATUS -DO NOT RESUSCITATE  Disposition -Discharge to skilled nursing facility when bed available.   Maretta Bees, MD. 06/27/2011, 9:49 AM

## 2011-06-28 LAB — BASIC METABOLIC PANEL
BUN: 15 mg/dL (ref 6–23)
Calcium: 8.8 mg/dL (ref 8.4–10.5)
Chloride: 93 mEq/L — ABNORMAL LOW (ref 96–112)
Creatinine, Ser: 0.71 mg/dL (ref 0.50–1.10)
GFR calc Af Amer: 82 mL/min — ABNORMAL LOW (ref >90)

## 2011-06-28 MED ORDER — FUROSEMIDE 40 MG PO TABS: 40.0000 mg | ORAL_TABLET | Freq: Every day | ORAL | Status: DC

## 2011-06-28 MED ORDER — OLMESARTAN MEDOXOMIL 40 MG PO TABS: 40.0000 mg | ORAL_TABLET | Freq: Every day | ORAL | Status: DC

## 2011-06-28 MED ORDER — POTASSIUM CHLORIDE CRYS ER 20 MEQ PO TBCR: 20.0000 meq | EXTENDED_RELEASE_TABLET | Freq: Every day | ORAL | Status: DC

## 2011-06-28 MED ORDER — METOPROLOL TARTRATE 50 MG PO TABS: 50.0000 mg | ORAL_TABLET | Freq: Two times a day (BID) | ORAL | Status: DC

## 2011-06-28 NOTE — Discharge Summary (Signed)
[name], [place] y.o[place] DOB [birth year], MRN [id number]. Admission date: 06/23/2011 Discharge Date 06/28/2011 Primary MD Marga Melnick, MD, MD Admitting Physician Jeoffrey Massed  Admission Diagnosis  abnormal labs   Discharge Diagnosis    Principal Problem:  *Diastolic CHF, acute on chronic  Active Problems:  HTN (hypertension), malignant  Anemia  Hyponatremia Carotid stenosis      Past Medical History  Diagnosis Date  . Osteoarthritis   . Dyslipidemia   . Carotid stenosis   . Hypertension   . CAD (coronary artery disease)   . Glaucoma   . Hearing loss   . Carcinoma     basal cell  . Cataract   . Anemia   . Macular degeneration   . Other abnormal glucose 2003  . Shortness of breath   . Myocardial infarction     Past Surgical History  Procedure Date  . Cataract extraction   . Tonsillectomy   . Non-stemi 2001    w/ a successful PTCA  . Stent of rca     unsuccessful PTCA of left circumflex  . Adenoidectomy   . Tonsillectomy   . Eye surgery     cataracts B eyes    Consults  none  Significant Tests:  See full reports for all details   Ct Angio Chest W/cm &/or Wo Cm  06/24/2011  *RADIOLOGY REPORT*  Clinical Data:  Dyspnea, elevated D-dimer.  Lower extremity swelling.  CT ANGIOGRAPHY CHEST WITH CONTRAST  Technique:  Multidetector CT imaging of the chest was performed using the standard protocol during bolus administration of intravenous contrast.  Multiplanar CT image reconstructions including MIPs were obtained to evaluate the vascular anatomy.  Contrast: OMNIPAQUE IOHEXOL 350 MG/ML IV SOLN  Comparison:  06/23/2011 radiograph  Findings:  No pulmonary arterial filling defect identified.  Mild tortuosity and ectasia of the thoracic aorta with advanced areas of atherosclerotic disease.  Heart size is within upper normal limits. Coronary artery and aortic valve calcification noted.  Mitral valve calcification present.  No intrathoracic lymphadenopathy.  Moderate  bilateral pleural effusions.  No pericardial effusion.  Limited images through the upper abdomen show no acute abnormality.  Parenchymal evaluation is significantly degraded by respiratory motion.  Within this limitation, there is suggestion of interlobular septal prominence and bibasilar opacities.  No pneumothorax.  Multilevel degenerative changes of the imaged spine. No acute or aggressive appearing osseous lesion.  Review of the MIP images confirms the above findings.  IMPRESSION:  CHF/pulmonary edema pattern with interlobular septal thickening and moderate bilateral pleural effusions.  Associated bibasilar opacities likely reflect atelectasis.  No pulmonary embolism identified.  Original Report Authenticated By: Waneta Martins, M.D.   US Carotid Duplex Bilateral  06/22/2011  *RADIOLOGY REPORT*  Clinical Data: History of TIA; history of hypertension and CAD  BILATERAL CAROTID DUPLEX ULTRASOUND  Technique: Gray scale imaging, color Doppler and duplex ultrasound was performed of bilateral carotid and vertebral arteries in the neck.  Comparison:  Brain MRI - 12/26/2009  Criteria:  Quantification of carotid stenosis is based on velocity parameters that correlate the residual internal carotid diameter with NASCET-based stenosis levels, using the diameter of the distal internal carotid lumen as the denominator for stenosis measurement.  The following velocity measurements were obtained:                   PEAK SYSTOLIC/END DIASTOLIC RIGHT ICA:  277/33cm/sec CCA:                        139/14cm/sec SYSTOLIC ICA/CCA RATIO:     10 DIASTOLIC ICA/CCA RATIO:    2.39 ECA:                        187cm/sec  LEFT ICA:                        483/65cm/sec CCA:                        107/12cm/sec SYSTOLIC ICA/CCA RATIO:     4.5 DIASTOLIC ICA/CCA RATIO:    5.5 ECA:                        133cm/sec  Findings:  RIGHT CAROTID ARTERY: There is a moderate to large amount of eccentric shadowing atherosclerotic  plaque within the right carotid bulb and proximal aspect of the right internal carotid artery. These findings are associated with elevated peak systolic velocity within the proximal and mid aspect of the internal carotid artery (peak systolic velocity - 277 cm/sec) compatible with a greater than 70% luminal narrowing).  RIGHT VERTEBRAL ARTERY:  Preserved antegrade flow  LEFT CAROTID ARTERY: There is a moderate to large amount of eccentric shadowing atherosclerotic plaque within the left carotid bulb and proximal aspect of the left internal carotid artery. These findings are associated with elevated peak systolic velocity within the proximal and mid aspect of the internal carotid artery (peak systolic velocity - 483 cm/sec, compatible with greater than 70% luminal narrowing.  LEFT VERTEBRAL ARTERY:  Not visualized, as was demonstrated on prior MRI  Incidental note is made of a 1 cm anechoic lesion within the posterior aspect of the right lobe of the thyroid which demonstrates increased through transmission however minimal mural nodularity.  IMPRESSION: 1.  Moderate to large amount of atherosclerotic plaque within the bilateral carotid bulbs and proximal aspects of the bilateral internal carotid arteries resulting in elevated peak systolic velocities compatible with greater than 70% luminal narrowing bilaterally, left greater than right. Comparison for interval change is difficult between different modalities.  2.  Nonvisualization of the left vertebral artery. 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  3. Incidental note made of a 1 cm minimally complex right-sided thyroid cyst.  Original Report Authenticated By: Waynard Reeds, M.D.   Dg Chest Portable 1 View  06/26/2011  *RADIOLOGY REPORT*  Clinical Data: SOB  PORTABLE CHEST - 1 VIEW  Comparison: 06/23/11  Findings: The heart size appears normal.  There are small bilateral pleural effusions and interstitial edema  consistent with CHF.  This is unchanged at from previous exam.  Atelectasis is noted within the lung bases.  IMPRESSION:  1.  CHF.  Unchanged from previous exam.  Original Report Authenticated By: Rosealee Albee, M.D.   Dg Chest Portable 1 View  06/23/2011  *RADIOLOGY REPORT*  Clinical Data: Short of breath.  Cough.  Myocardial infarction.  PORTABLE CHEST - 1 VIEW  Comparison: 01/27/2011  Findings: Mild cardiomegaly.  Bibasilar opacity.  Small bilateral effusions.  Kerley B lines at the bases.  No pneumothorax.  IMPRESSION: CHF with mild basilar edema and small pleural effusions.  Original Report Authenticated By: Donavan Burnet, M.D.  Hospital Course See H&P, Labs, Consult and Test reports for all details in brief, patient was admitted for exertional dyspnea and significant lower extremity edema. Admitting diagnoses was acute diastolic heart failure.  #1 acute on chronic diastolic heart failure. -Patient had evidence of significant volume overload on admission. -She was started on IV furosemide, she was continued on her Lopressor and Benicar. -Patient's weight on admission was 131 pounds, with diuresis weight on discharge has decreased to 116 pounds. -Lasix has been changed to 40 mg by mouth daily. -She continues to be on Lopressor and Benicar. -Further optimization of these medications will need to be done by her rounding physician at the skilled nursing facility. -Please note that this patient is elderly and frail, her does of Lasix will need to be adjusted as a cautiously, as she has had episodes of dehydration in the past with this.  #2 anemia -Stable continue with ferrous sulfate. -This is a chronic issue, given patient's age no further workup is required in my opinion.  #3 hyponatremia -This is perhaps likely secondary to volume overload or a reset thermostat up. -This also is a chronic issue, her last sodium today is 129.  #4 hypertension -She is to continue Lopressor and  Benicar, her blood pressure seems to be moderately well controlled with this. -Further optimization of these medications would need to be done by her rounding physician at the skilled nursing facility.  #4 history of carotid stenosis. -A carotid Doppler done during this admission shows a moderate to large amount of atherosclerotic plate within the bilateral carotid bulbs and the proximal aspects of the bilateral internal carotid arteries resulting in elevated peak systolic velocities compatible with greater than 70% narrowing bilaterally. -Given her age she's not a candidate for carotid endarterectomy. -.Further surveillance carotid Dopplers are even required given her age as well.  #4 CODE STATUS -DO NOT RESUSCITATE  Disposition -Patient is currently awaiting a bed to be available at skilled nursing facility.  Today   Subjective:   [name] today has no headache,no chest abdominal pain,no new weakness tingling or numbness, feels much better wants to go home today.   Objective:   Blood pressure 153/67, pulse 87, temperature 97.7 F (36.5 C), temperature source Oral, resp. rate 18, height 5\' 6"  (1.676 m), weight 52.9 kg (116 lb 10 oz), SpO2 94.00%.  Intake/Output Summary (Last 24 hours) at 06/28/11 1028 Last data filed at 06/28/11 0700  Gross per 24 hour  Intake   1020 ml  Output    900 ml  Net    120 ml    Exam Awake Alert, Oriented *3, No new F.N deficits, Normal affect [place].AT,PERRAL Supple Neck,No JVD, No cervical lymphadenopathy appriciated.  Symmetrical Chest wall movement, Good air movement bilaterally, CTAB RRR,No Gallops,Rubs or new Murmurs, No Parasternal Heave +ve B.Sounds, Abd Soft, Non tender, No organomegaly appriciated, No rebound -guarding or rigidity. No Cyanosis, Clubbing or edema, No new Rash or bruise  Data Review  Cultures - none obtained  CBC w Diff: Lab Results  Component Value Date   WBC 10.7* 06/26/2011   HGB 8.6* 06/26/2011   HCT 27.7*  06/26/2011   PLT 441* 06/26/2011   LYMPHOPCT 5* 06/24/2011   MONOPCT 9 06/24/2011   EOSPCT 1 06/24/2011   BASOPCT 0 06/24/2011   CMP: Lab Results  Component Value Date   NA 129* 06/28/2011   K 3.8 06/28/2011   CL 93* 06/28/2011   CO2 28 06/28/2011   BUN 15 06/28/2011  CREATININE 0.71 06/28/2011   PROT 7.3 01/29/2011   ALBUMIN 3.7 01/29/2011   BILITOT 0.7 01/29/2011   ALKPHOS 83 01/29/2011   AST 25 01/29/2011   ALT 14 01/29/2011  . Discharge Instructions     Follow-up Information    Follow up with GREEN, Lenon Curt, MD in 2 weeks.   Contact information:   [address] [place] [address] [phone]       Follow up with Tonny Bollman, MD. (As needed)    Contact information:   [address] [place] [address] [phone]          Discharge Medications   Medication List  As of 06/28/2011 10:28 AM   START taking these medications         olmesartan 40 MG tablet   Commonly known as: BENICAR   Take 1 tablet (40 mg total) by mouth daily.      potassium chloride SA 20 MEQ tablet   Commonly known as: K-DUR,KLOR-CON   Take 1 tablet (20 mEq total) by mouth daily.         CHANGE how you take these medications         furosemide 40 MG tablet   Commonly known as: LASIX   Take 1 tablet (40 mg total) by mouth daily.   What changed: - medication strength - dose      metoprolol 50 MG tablet   Commonly known as: LOPRESSOR   Take 1 tablet (50 mg total) by mouth 2 (two) times daily.   What changed: - medication strength - dose         CONTINUE taking these medications         acetaminophen 325 MG tablet   Commonly known as: TYLENOL      bimatoprost 0.03 % ophthalmic solution   Commonly known as: LUMIGAN      brinzolamide 1 % ophthalmic suspension   Commonly known as: AZOPT      CERTAVITE/ANTIOXIDANTS PO      docusate sodium 100 MG capsule   Commonly known as: COLACE      DULoxetine 30  MG capsule   Commonly known as: CYMBALTA      ferrous sulfate 325 (65 FE) MG tablet      ondansetron 8 MG tablet   Commonly known as: ZOFRAN      polyethylene glycol powder powder   Commonly known as: GLYCOLAX/MIRALAX      vitamin C 500 MG tablet   Commonly known as: ASCORBIC ACID         STOP taking these medications         valsartan 160 MG tablet          Where to get your medications       Information on where to get these meds is not yet available. Ask your nurse or doctor.         furosemide 40 MG tablet   metoprolol 50 MG tablet   olmesartan 40 MG tablet   potassium chloride SA 20 MEQ tablet            Total Time in preparing paper work, data evaluation and todays exam - 35 minutes  Signed: Jeoffrey Massed 06/28/2011 10:28 AM  The patient was admitted to the Hospitalist Service.

## 2011-06-28 NOTE — Progress Notes (Signed)
Clinical Social Worker received phone call from [facility] who stated that a bed has opened in the skilled section of [facility], but would not be available until Tuesday 11/13 and [facility] willing to accept pt back to ALF and then transfer pt to skilled section of facility. [facility] stated they are able to provide added supervision for pt in the ALF while awaiting the SNF bed to become available. Clinical Social Worker contacted pt son to inform him of [facility] availability and pt son would like pt to return to [facility] and plans to transport pt at D/C. Clinical Social Worker to facilitate pt D/C needs.  Jacklynn Lewis, MSW, LCSWA  Clinical Social Work [phone]

## 2012-09-22 LAB — CBC AND DIFFERENTIAL
HCT: 37 % (ref 36–46)
Hemoglobin: 12.3 g/dL (ref 12.0–16.0)
WBC: 8.9 10^3/mL

## 2012-10-14 IMAGING — CT CT ANGIO CHEST
2 of 7 series · 19 of 46 positions shown · IV contrast (APPLIED)
Comparison: 06/23/2011 radiograph

CLINICAL DATA: Dyspnea, elevated D-dimer.  Lower extremity
swelling.

CT ANGIOGRAPHY CHEST WITH CONTRAST
TECHNIQUE: Multidetector CT imaging of the chest was performed
using the standard protocol during bolus administration of
intravenous contrast.  Multiplanar CT image reconstructions
including MIPs were obtained to evaluate the vascular anatomy.
Contrast: 100mL OMNIPAQUE IOHEXOL 350 MG/ML IV SOLN

[Series 9: pulm embolism 1.0 b25f thin · axial · 0.70mm/px · z∈[-99,+168]mm · 16 of 301 slices shown]
[im 17/301  lung]
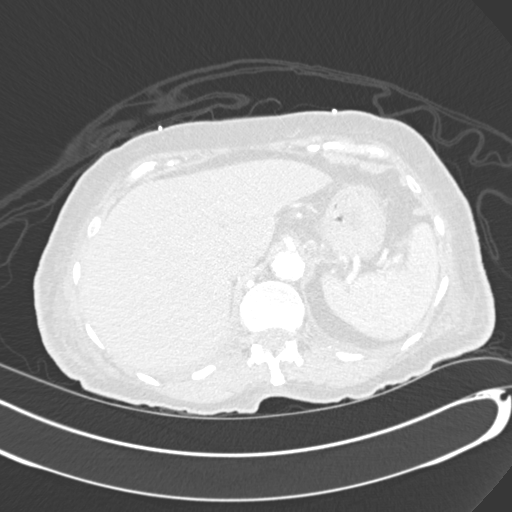
[im 34/301  soft-tissue]
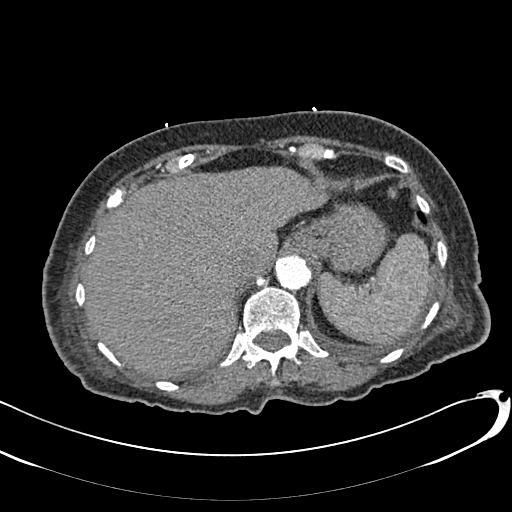
[im 51/301  lung]
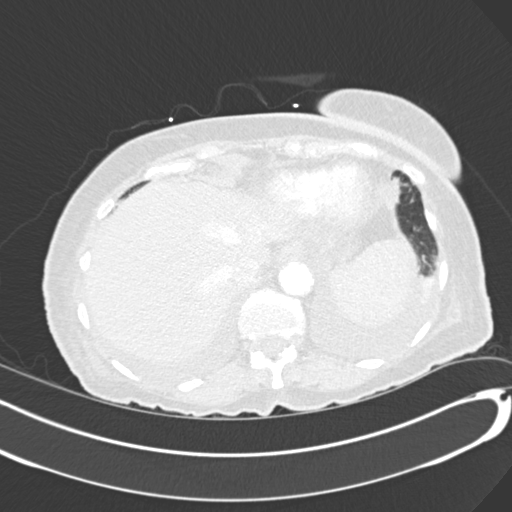
[im 67/301  soft-tissue]
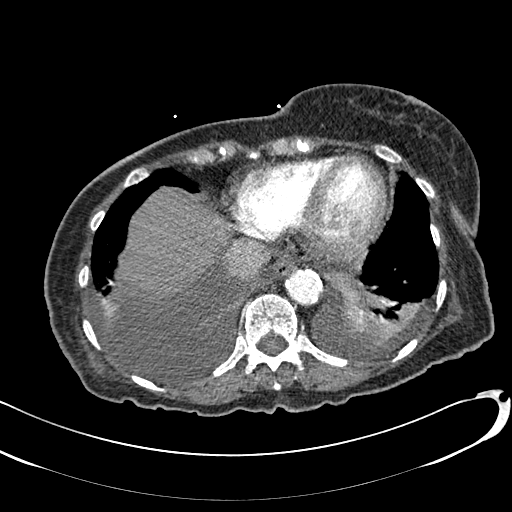
[im 84/301  lung]
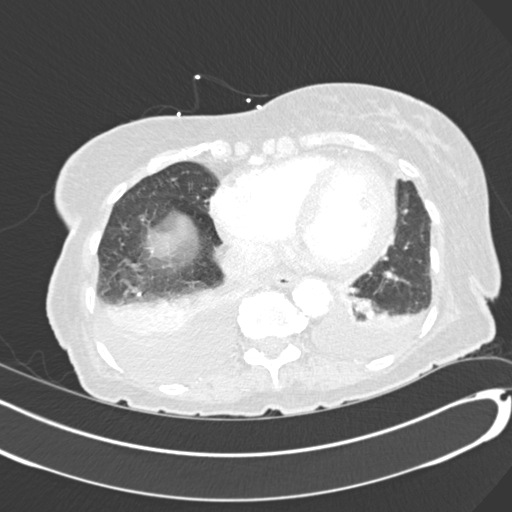
[im 101/301  soft-tissue]
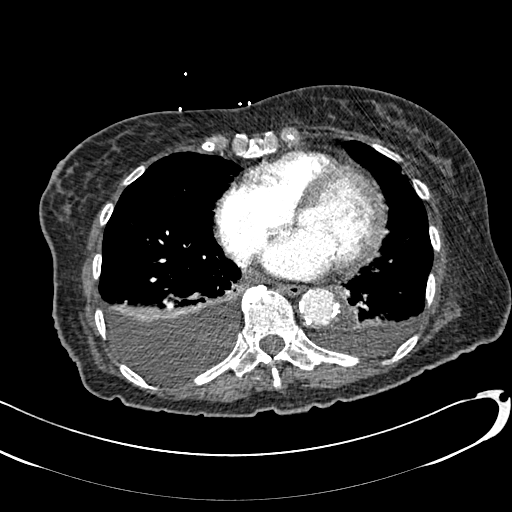
[im 117/301  lung]
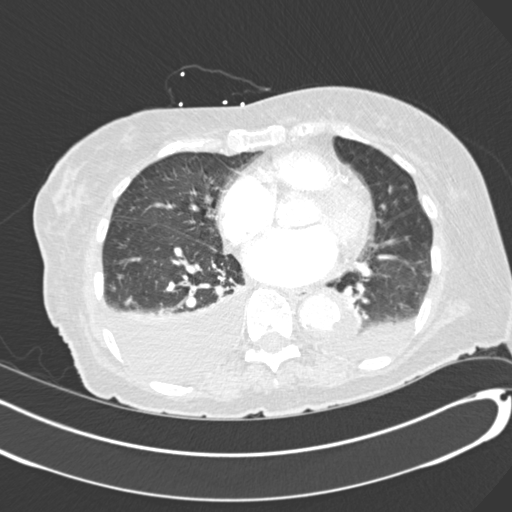
[im 134/301  soft-tissue]
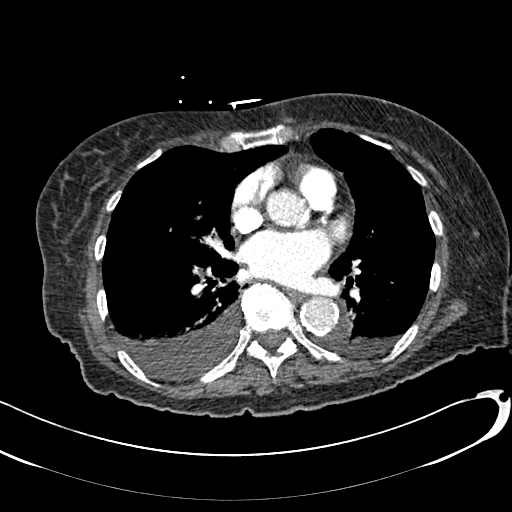
[im 167/301  lung]
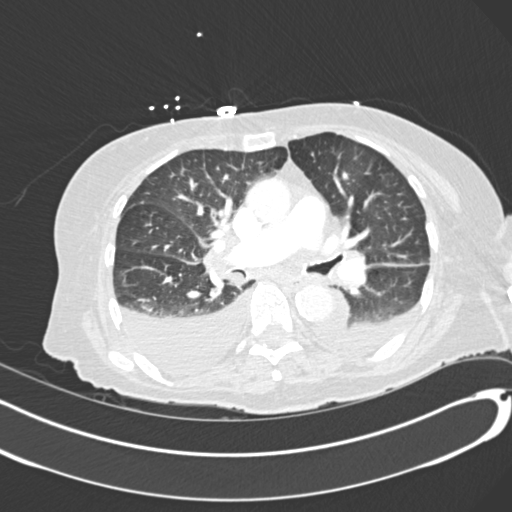
[im 184/301  soft-tissue]
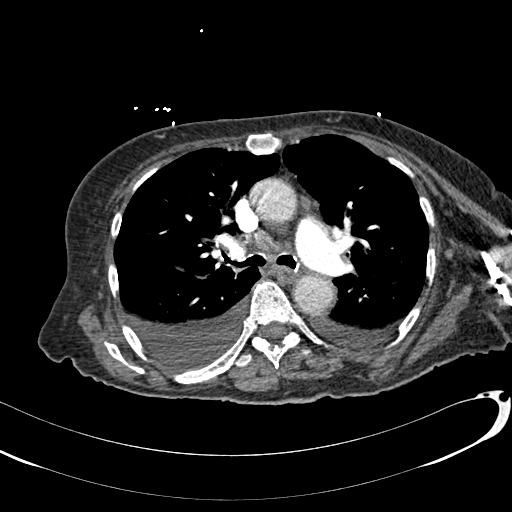
[im 201/301  lung]
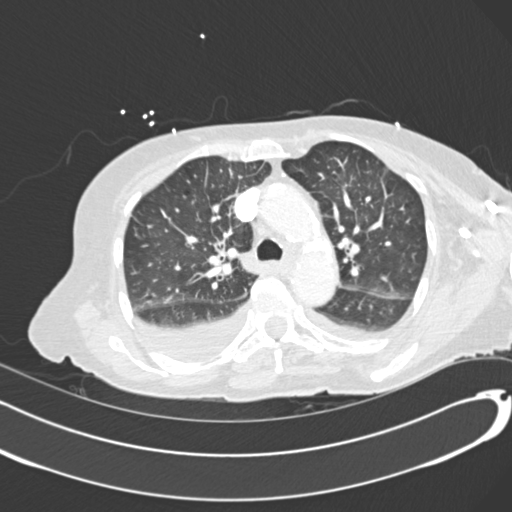
[im 217/301  soft-tissue]
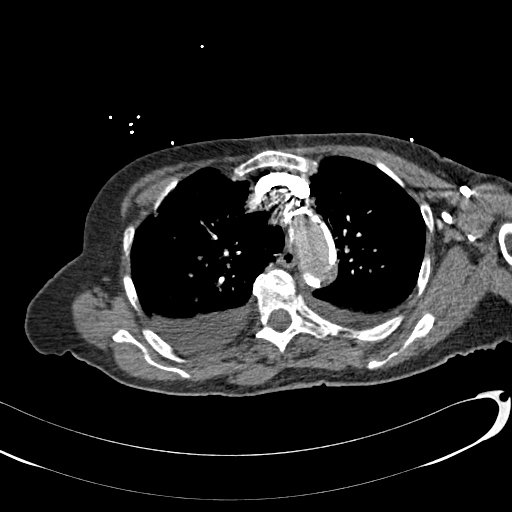
[im 234/301  lung]
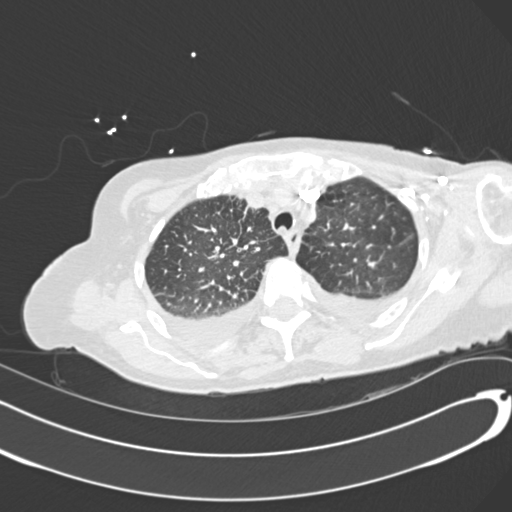
[im 251/301  soft-tissue]
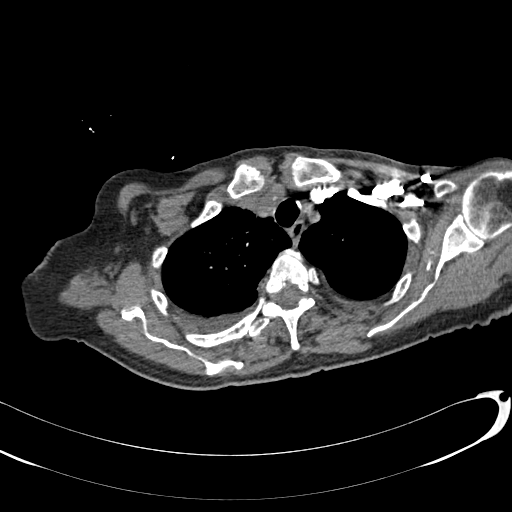
[im 267/301  lung]
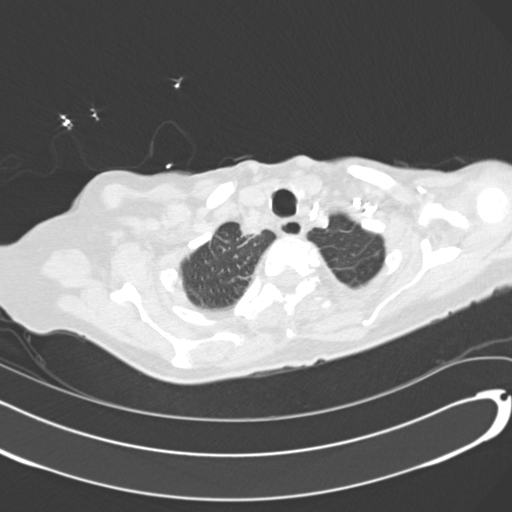
[im 284/301  soft-tissue]
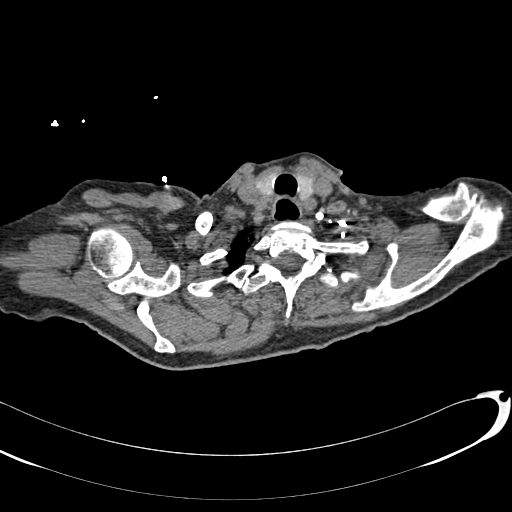

[Series 602: coronal · coronal · 0.70mm/px · 3 of 95 slices shown]
[im 24/95  soft-tissue]
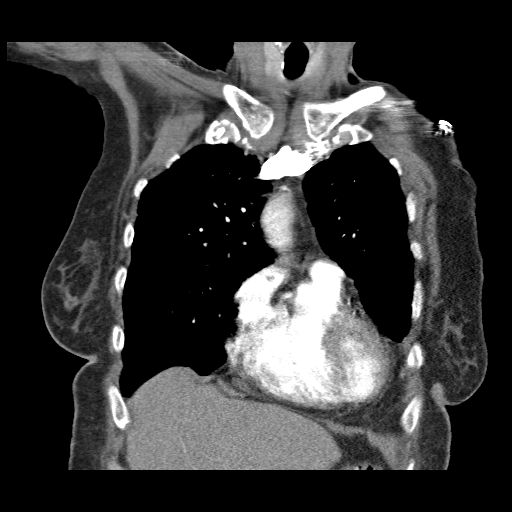
[im 48/95  soft-tissue]
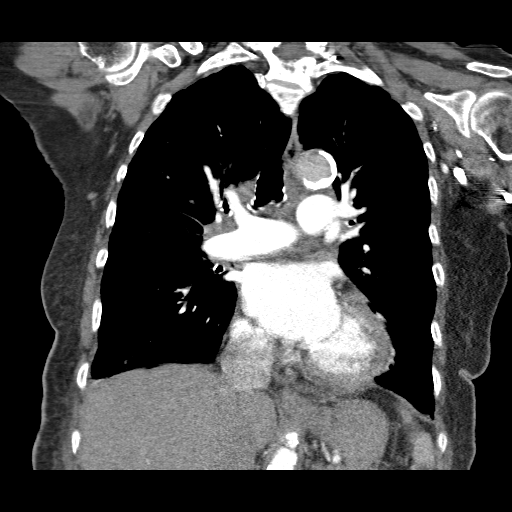
[im 71/95  soft-tissue]
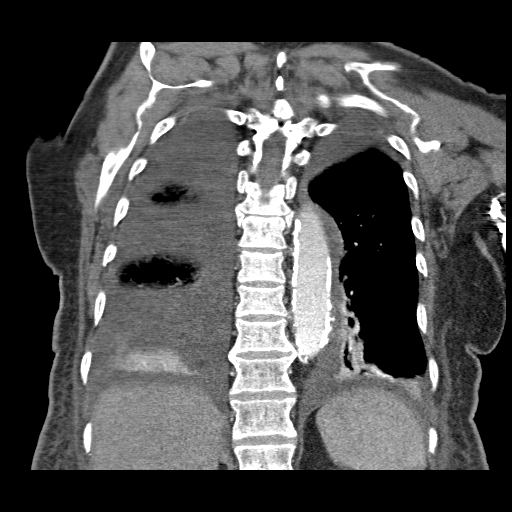

[19 of 46 positions shown; findings below may reference images not displayed]

FINDINGS: No pulmonary arterial filling defect identified.  Mild
tortuosity and ectasia of the thoracic aorta with advanced areas of
atherosclerotic disease.  Heart size is within upper normal limits.
Coronary artery and aortic valve calcification noted.  Mitral valve
calcification present.  No intrathoracic lymphadenopathy.  Moderate
bilateral pleural effusions.  No pericardial effusion.

Limited images through the upper abdomen show no acute abnormality.

Parenchymal evaluation is significantly degraded by respiratory
motion.  Within this limitation, there is suggestion of
interlobular septal prominence and bibasilar opacities.  No
pneumothorax.

Multilevel degenerative changes of the imaged spine. No acute or
aggressive appearing osseous lesion.

Review of the MIP images confirms the above findings.
IMPRESSION: CHF/pulmonary edema pattern with interlobular septal thickening and
moderate bilateral pleural effusions.  Associated bibasilar
opacities likely reflect atelectasis.

No pulmonary embolism identified.

## 2012-10-16 IMAGING — CR DG CHEST 1V PORT
1 series · 1 of 1 positions shown · non-contrast
Comparison: 06/23/11

CLINICAL DATA: SOB

PORTABLE CHEST - 1 VIEW

[AP]
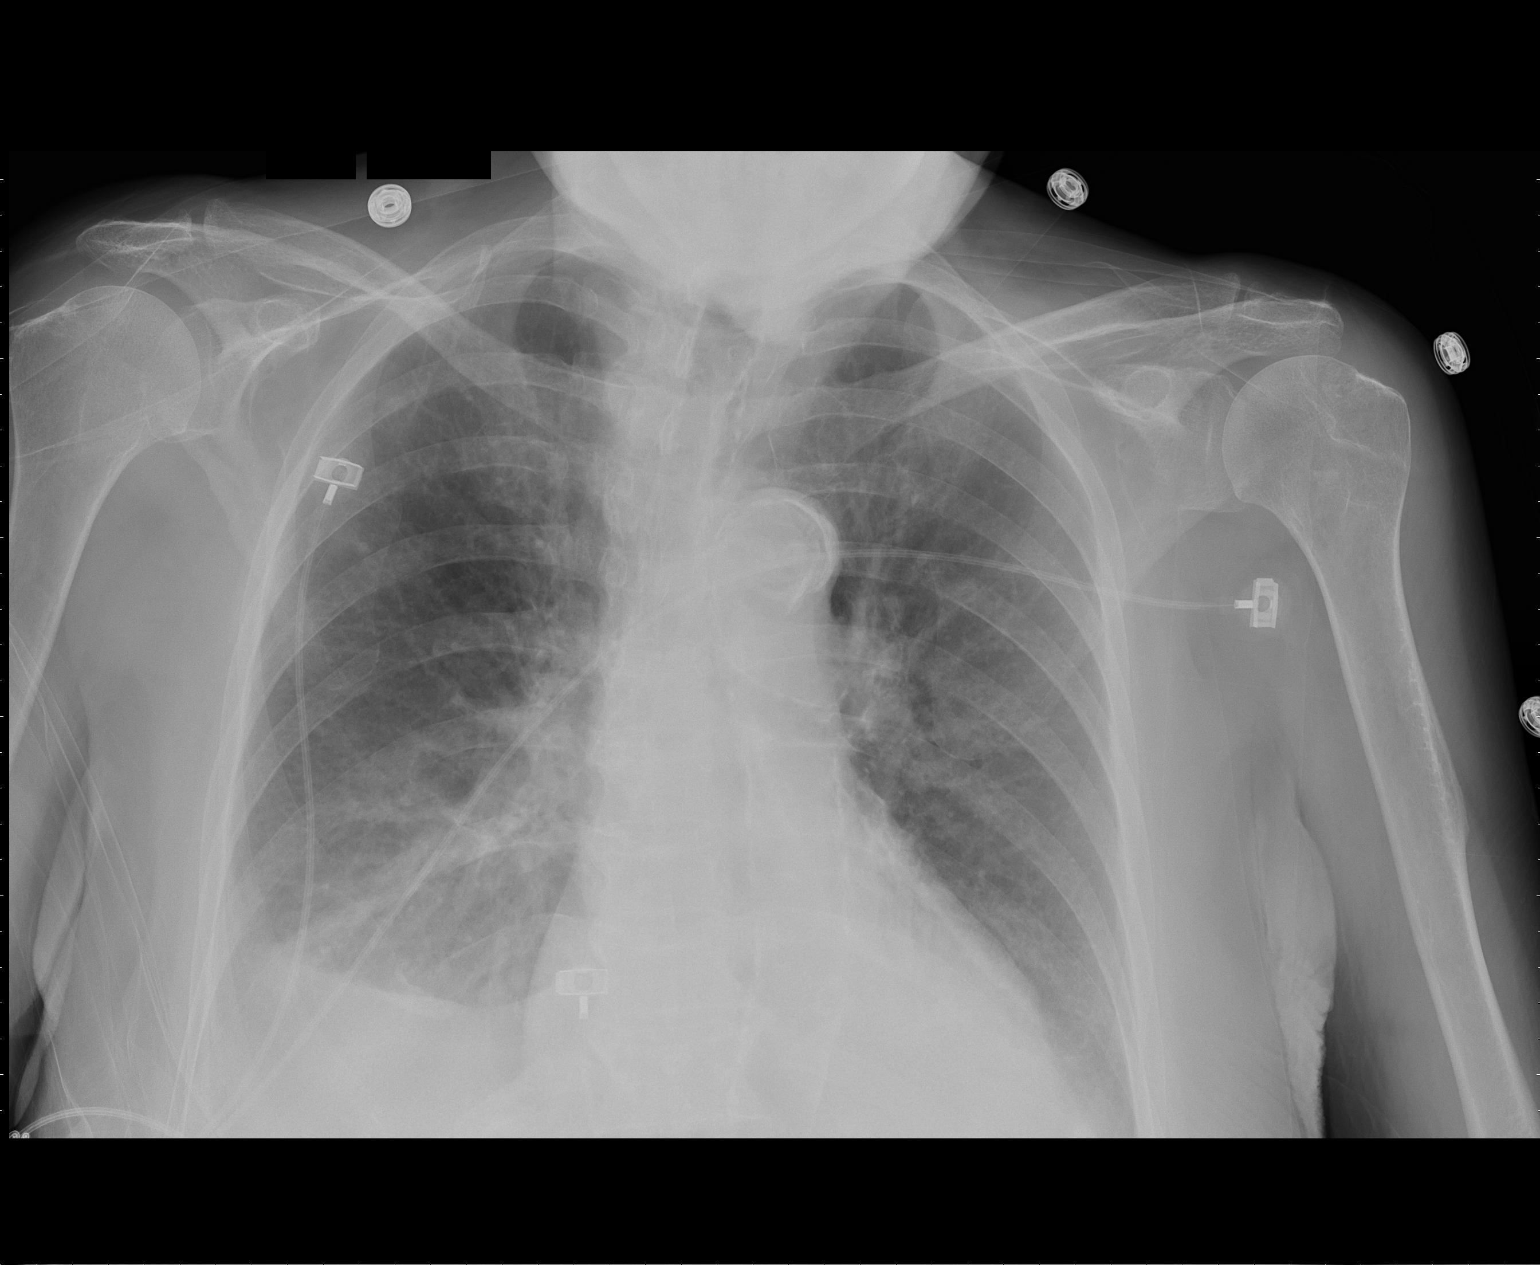

[1 of 1 positions shown; findings below may reference images not displayed]

FINDINGS: The heart size appears normal.

There are small bilateral pleural effusions and interstitial edema
consistent with CHF.  This is unchanged at from previous exam.

Atelectasis is noted within the lung bases.
IMPRESSION: 1.  CHF.  Unchanged from previous exam.

## 2012-10-24 LAB — BASIC METABOLIC PANEL: Potassium: 5 mmol/L (ref 3.4–5.3)

## 2012-11-20 ENCOUNTER — Non-Acute Institutional Stay (SKILLED_NURSING_FACILITY): Payer: Medicare Other | Admitting: Nurse Practitioner

## 2012-11-20 ENCOUNTER — Encounter: Payer: Self-pay | Admitting: Nurse Practitioner

## 2012-11-20 DIAGNOSIS — D649 Anemia, unspecified: Secondary | ICD-10-CM

## 2012-11-20 DIAGNOSIS — I5033 Acute on chronic diastolic (congestive) heart failure: Secondary | ICD-10-CM

## 2012-11-20 DIAGNOSIS — I509 Heart failure, unspecified: Secondary | ICD-10-CM

## 2012-11-20 DIAGNOSIS — E039 Hypothyroidism, unspecified: Secondary | ICD-10-CM | POA: Insufficient documentation

## 2012-11-20 DIAGNOSIS — I10 Essential (primary) hypertension: Secondary | ICD-10-CM

## 2012-11-20 LAB — BRAIN NATRIURETIC PEPTIDE: BNP: 141.7 pg/mL

## 2012-11-20 NOTE — Assessment & Plan Note (Signed)
Controlled on Losartan 100mg and Metoprolol 50mg bid   

## 2012-11-20 NOTE — Progress Notes (Signed)
Patient ID: [name], female   DOB: August 08, 1914, 79 y.o.   MRN: [id number]  Chief Complaint:  Chief Complaint  Patient presents with  . Medical Managment of Chronic Issues     HPI:   The patient has been under Hospice care. Weights have been stable for the past 2 months. No longer c/o sore mouth and throat.    Review of Systems:  Review of Systems  Constitutional: Positive for malaise/fatigue (chronic). Negative for fever, chills, weight loss and diaphoresis.  HENT: Positive for hearing loss (severe). Negative for ear pain, congestion and sore throat.   Eyes: Negative for pain and discharge.  Respiratory: Positive for shortness of breath. Negative for cough, sputum production and wheezing.   Cardiovascular: Positive for PND (chronic). Negative for chest pain, palpitations, orthopnea, claudication and leg swelling.  Gastrointestinal: Negative for nausea, vomiting, abdominal pain, diarrhea and constipation.  Genitourinary: Positive for frequency (incontinent of bladder). Negative for dysuria, urgency and flank pain.  Musculoskeletal: Positive for myalgias (chronic), back pain (chronic) and falls.  Skin: Negative for itching and rash.  Neurological: Positive for weakness (generalized). Negative for dizziness, tremors, speech change, seizures, loss of consciousness and headaches.  Endo/Heme/Allergies: Negative for environmental allergies and polydipsia. Does not bruise/bleed easily.  Psychiatric/Behavioral: Positive for depression (flat affect) and memory loss. Negative for hallucinations. The patient is not nervous/anxious and does not have insomnia.      Medications: Patient's Medications  New Prescriptions   No medications on file  Previous Medications   BRINZOLAMIDE (AZOPT) 1 % OPHTHALMIC SUSPENSION    Place 1 drop into both eyes 2 (two) times daily.    DOCUSATE SODIUM (COLACE) 100 MG CAPSULE    Take 100 mg by mouth 2 (two) times daily.     DULOXETINE (CYMBALTA) 30 MG CAPSULE     Take 60 mg by mouth daily.    HYDROCODONE-ACETAMINOPHEN (NORCO/VICODIN) 5-325 MG PER TABLET    Take 1 tablet by mouth 4 (four) times daily.   LATANOPROST (XALATAN) 0.005 % OPHTHALMIC SOLUTION    Place 1 drop into both eyes at bedtime.   LEVOTHYROXINE (SYNTHROID, LEVOTHROID) 75 MCG TABLET    Take 37.5 mcg by mouth daily before breakfast.   LOSARTAN (COZAAR) 100 MG TABLET    Take 100 mg by mouth daily.   METOPROLOL (LOPRESSOR) 50 MG TABLET    Take 1 tablet (50 mg total) by mouth 2 (two) times daily.   MULTIPLE VITAMINS-MINERALS (CERTAVITE/ANTIOXIDANTS PO)    Take 1 tablet by mouth daily. Certavite-anitoxidant 18 mg-0.4 mg   Modified Medications   Modified Medication Previous Medication   FUROSEMIDE (LASIX) 40 MG TABLET furosemide (LASIX) 40 MG tablet      Take 60 mg by mouth daily.    Take 1 tablet (40 mg total) by mouth daily.  Discontinued Medications   ACETAMINOPHEN (TYLENOL) 325 MG TABLET    Take 650 mg by mouth daily.     ASCORBIC ACID (VITAMIN C) 500 MG TABLET    Take 500 mg by mouth daily.     BIMATOPROST (LUMIGAN) 0.03 % OPHTHALMIC DROPS    Place 1 drop into both eyes at bedtime.    FERROUS SULFATE 325 (65 FE) MG TABLET    Take 325 mg by mouth 2 (two) times daily.     OLMESARTAN (BENICAR) 40 MG TABLET    Take 1 tablet (40 mg total) by mouth daily.   ONDANSETRON (ZOFRAN) 8 MG TABLET    Take 8 mg by mouth every 6 (  six) hours as needed. For nausea/vomiting    POLYETHYLENE GLYCOL POWDER (GLYCOLAX/MIRALAX) POWDER    Take 17 g by mouth daily as needed.     POTASSIUM CHLORIDE SA (K-DUR,KLOR-CON) 20 MEQ TABLET    Take 1 tablet (20 mEq total) by mouth daily.     Physical Exam: Physical Exam  Constitutional: She appears well-developed and well-nourished. No distress.  HENT:  Head: Atraumatic.  Eyes: EOM are normal. Pupils are equal, round, and reactive to light.  Neck: Normal range of motion. Neck supple. No JVD present. No thyromegaly present.  Cardiovascular: Normal rate and regular  rhythm.   No murmur heard. Pulmonary/Chest: Effort normal and breath sounds normal. She has no wheezes. She has no rales.  Abdominal: Soft. Bowel sounds are normal. There is no tenderness.  Musculoskeletal: Normal range of motion. She exhibits no edema and no tenderness.  Lymphadenopathy:    She has no cervical adenopathy.  Neurological: She is alert. She has normal reflexes. She displays normal reflexes. No cranial nerve deficit. Coordination normal.  Skin: Skin is warm and dry. No rash noted. She is not diaphoretic.     Filed Vitals:   11/20/12 1015  BP: 116/60  Pulse: 73  Temp: 98.4 F (36.9 C)  TempSrc: Tympanic  Resp: 18      Labs reviewed:  Lab Results  Component Value Date   NA 139 10/24/2012   K 5.0 10/24/2012   CREATININE 0.9 10/24/2012   BUN 22* 10/24/2012   TSH 4.613 09/19/2012               Assessment/Plan Anemia Resolved, off Fe, last Hgb 12.3 09/22/12  HTN (hypertension), malignant Controlled on Losartan 100mg  and Metoprolol 50mg  bid  Diastolic CHF, acute on chronic Compensated on Furosemide 60mg  daily, BNP 141.7 10/24/12, bun/crea 22/0.86, K 5.0 3 10/24/12--update BMP   Unspecified hypothyroidism Last TSH 4.613, takes Levothyroxine 37.19mcg, f/u TSH next lab.       Family/ staff Communication: safety ambulation.    Goals of care: continue Hospice care at SNF   Labs/tests ordered   TSH and BMP

## 2012-11-20 NOTE — Assessment & Plan Note (Signed)
Last TSH 4.613, takes Levothyroxine 37.63mcg, f/u TSH next lab.

## 2012-11-20 NOTE — Assessment & Plan Note (Signed)
Resolved, off Fe, last Hgb 12.3 09/22/12

## 2012-11-20 NOTE — Assessment & Plan Note (Signed)
Compensated on Furosemide 60mg  daily, BNP 141.7 10/24/12, bun/crea 22/0.86 10/24/12

## 2012-11-21 LAB — BASIC METABOLIC PANEL
Potassium: 4.3 mmol/L (ref 3.4–5.3)
Sodium: 138 mmol/L (ref 137–147)

## 2012-12-14 LAB — TSH: TSH: 4.28 u[IU]/mL (ref <=5.90)

## 2012-12-18 ENCOUNTER — Non-Acute Institutional Stay (SKILLED_NURSING_FACILITY): Payer: Medicare Other | Admitting: Nurse Practitioner

## 2012-12-18 DIAGNOSIS — F329 Major depressive disorder, single episode, unspecified: Secondary | ICD-10-CM

## 2012-12-18 DIAGNOSIS — E039 Hypothyroidism, unspecified: Secondary | ICD-10-CM

## 2012-12-18 DIAGNOSIS — F32A Depression, unspecified: Secondary | ICD-10-CM

## 2012-12-18 DIAGNOSIS — I5033 Acute on chronic diastolic (congestive) heart failure: Secondary | ICD-10-CM

## 2012-12-18 DIAGNOSIS — I10 Essential (primary) hypertension: Secondary | ICD-10-CM

## 2012-12-18 DIAGNOSIS — I509 Heart failure, unspecified: Secondary | ICD-10-CM

## 2012-12-18 DIAGNOSIS — Z8669 Personal history of other diseases of the nervous system and sense organs: Secondary | ICD-10-CM

## 2012-12-18 NOTE — Assessment & Plan Note (Signed)
Stabilized in SNF and Cymbalta 60mg 

## 2012-12-18 NOTE — Progress Notes (Signed)
Patient ID: [name], female   DOB: 08-23-1913, 79 y.o.   MRN: [id number]  Chief Complaint:  Chief Complaint  Patient presents with  . Medical Managment of Chronic Issues    hypothyroidism     HPI:   Problem List Items Addressed This Visit     ICD-9-CM   CATARACT, HX OF     Pain is well controlled on Cymbalta 60mg  and Hydrocodone/APAP 5/325 qid    Diastolic CHF, acute on chronic     Compensated on Furosemide 60mg  daily, BNP 141.7 10/24/12, bun/crea 29/0.89 11/21/12      HTN (hypertension), malignant     Controlled on Losartan 100mg  and Metoprolol 50mg  bid      Unspecified hypothyroidism - Primary     Last TSH 4.613, takes Levothyroxine 37.16mcg, f/u TSH 2.18 11/21/12      Depression     Stabilized in SNF and Cymbalta 60mg        Review of Systems:  Review of Systems  Constitutional: Positive for malaise/fatigue (chronic). Negative for fever, chills, weight loss and diaphoresis.  HENT: Positive for hearing loss (severe). Negative for ear pain, congestion and sore throat.   Eyes: Negative for pain and discharge.  Respiratory: Positive for shortness of breath. Negative for cough, sputum production and wheezing.   Cardiovascular: Positive for PND (chronic). Negative for chest pain, palpitations, orthopnea, claudication and leg swelling.  Gastrointestinal: Negative for nausea, vomiting, abdominal pain, diarrhea and constipation.  Genitourinary: Positive for frequency (incontinent of bladder). Negative for dysuria, urgency and flank pain.  Musculoskeletal: Positive for myalgias (chronic), back pain (chronic) and falls.  Skin: Negative for itching and rash.  Neurological: Positive for weakness (generalized). Negative for dizziness, tremors, speech change, seizures, loss of consciousness and headaches.  Endo/Heme/Allergies: Negative for environmental allergies and polydipsia. Does not bruise/bleed easily.  Psychiatric/Behavioral: Positive for depression (flat affect) and memory  loss. Negative for hallucinations. The patient is not nervous/anxious and does not have insomnia.      Medications: Patient's Medications  New Prescriptions   No medications on file  Previous Medications   BRINZOLAMIDE (AZOPT) 1 % OPHTHALMIC SUSPENSION    Place 1 drop into both eyes 2 (two) times daily.    DOCUSATE SODIUM (COLACE) 100 MG CAPSULE    Take 100 mg by mouth 2 (two) times daily.     DULOXETINE (CYMBALTA) 30 MG CAPSULE    Take 60 mg by mouth daily.    FUROSEMIDE (LASIX) 40 MG TABLET    Take 60 mg by mouth daily.   HYDROCODONE-ACETAMINOPHEN (NORCO/VICODIN) 5-325 MG PER TABLET    Take 1 tablet by mouth 4 (four) times daily.   LATANOPROST (XALATAN) 0.005 % OPHTHALMIC SOLUTION    Place 1 drop into both eyes at bedtime.   LEVOTHYROXINE (SYNTHROID, LEVOTHROID) 75 MCG TABLET    Take 37.5 mcg by mouth daily before breakfast.   LOSARTAN (COZAAR) 100 MG TABLET    Take 100 mg by mouth daily.   METOPROLOL (LOPRESSOR) 50 MG TABLET    Take 1 tablet (50 mg total) by mouth 2 (two) times daily.   MULTIPLE VITAMINS-MINERALS (CERTAVITE/ANTIOXIDANTS PO)    Take 1 tablet by mouth daily. Certavite-anitoxidant 18 mg-0.4 mg   Modified Medications   No medications on file  Discontinued Medications   No medications on file     Physical Exam: Physical Exam  Constitutional: She appears well-developed and well-nourished. No distress.  HENT:  Head: Atraumatic.  Eyes: EOM are normal. Pupils are equal, round, and reactive  to light.  Neck: Normal range of motion. Neck supple. No JVD present. No thyromegaly present.  Cardiovascular: Normal rate and regular rhythm.   No murmur heard. Pulmonary/Chest: Effort normal and breath sounds normal. She has no wheezes. She has no rales.  Abdominal: Soft. Bowel sounds are normal. There is no tenderness.  Musculoskeletal: Normal range of motion. She exhibits no edema and no tenderness.  Lymphadenopathy:    She has no cervical adenopathy.  Neurological: She is  alert. She has normal reflexes. She displays normal reflexes. No cranial nerve deficit. Coordination normal.  Skin: Skin is warm and dry. No rash noted. She is not diaphoretic.     Filed Vitals:   12/18/12 1036  BP: 140/60  Pulse: 83  Temp: 98.7 F (37.1 C)  TempSrc: Tympanic  Resp: 16      Labs reviewed: Basic Metabolic Panel:  Recent Labs  16/10/96 11/21/12  NA 139 138  K 5.0 4.3  BUN 22* 29*  CREATININE 0.9 0.9  TSH  --  2.18    Liver Function Tests: No results found for this basename: AST, ALT, ALKPHOS, BILITOT, PROT, ALBUMIN,  in the last 8760 hours  CBC:  Recent Labs  09/22/12  WBC 8.9  HGB 12.3  HCT 37  PLT 324    Anemia Panel: No results found for this basename: IRON, FOLATE, VITAMINB12,  in the last 8760 hours  Significant Diagnostic Results:     Assessment/Plan Unspecified hypothyroidism Last TSH 4.613, takes Levothyroxine 37.52mcg, f/u TSH 2.18 11/21/12    Diastolic CHF, acute on chronic Compensated on Furosemide 60mg  daily, BNP 141.7 10/24/12, bun/crea 29/0.89 11/21/12    Depression Stabilized in SNF and Cymbalta 60mg   HTN (hypertension), malignant Controlled on Losartan 100mg  and Metoprolol 50mg  bid    CATARACT, HX OF Pain is well controlled on Cymbalta 60mg  and Hydrocodone/APAP 5/325 qid      Family/ staff Communication: safety   Goals of care: SNF and Hospice Care.    Labs/tests ordered none

## 2012-12-18 NOTE — Assessment & Plan Note (Signed)
Compensated on Furosemide 60mg daily, BNP 141.7 10/24/12, bun/crea 29/0.89 11/21/12     

## 2012-12-18 NOTE — Assessment & Plan Note (Addendum)
Last TSH 4.613, takes Levothyroxine 37.28mcg, f/u TSH 2.18 11/21/12

## 2012-12-18 NOTE — Assessment & Plan Note (Signed)
Pain is well controlled on Cymbalta 60mg  and Hydrocodone/APAP 5/325 qid

## 2012-12-18 NOTE — Assessment & Plan Note (Signed)
Controlled on Losartan 100mg and Metoprolol 50mg bid   

## 2013-01-15 ENCOUNTER — Non-Acute Institutional Stay (SKILLED_NURSING_FACILITY): Payer: Medicare Other | Admitting: Nurse Practitioner

## 2013-01-15 DIAGNOSIS — I10 Essential (primary) hypertension: Secondary | ICD-10-CM

## 2013-01-15 DIAGNOSIS — I509 Heart failure, unspecified: Secondary | ICD-10-CM

## 2013-01-15 DIAGNOSIS — E039 Hypothyroidism, unspecified: Secondary | ICD-10-CM

## 2013-01-15 DIAGNOSIS — I5033 Acute on chronic diastolic (congestive) heart failure: Secondary | ICD-10-CM

## 2013-01-15 DIAGNOSIS — Z66 Do not resuscitate: Secondary | ICD-10-CM | POA: Insufficient documentation

## 2013-01-15 DIAGNOSIS — M1990 Unspecified osteoarthritis, unspecified site: Secondary | ICD-10-CM

## 2013-01-15 DIAGNOSIS — F329 Major depressive disorder, single episode, unspecified: Secondary | ICD-10-CM

## 2013-01-15 NOTE — Assessment & Plan Note (Signed)
Controlled on Losartan 100mg and Metoprolol 50mg bid   

## 2013-01-15 NOTE — Assessment & Plan Note (Signed)
Stabilized in SNF and Cymbalta 60mg 

## 2013-01-15 NOTE — Assessment & Plan Note (Signed)
Compensated on Furosemide 60mg  daily, BNP 141.7 10/24/12, bun/crea 29/0.89 11/21/12

## 2013-01-15 NOTE — Progress Notes (Signed)
Patient ID: [name], female   DOB: [birth year], 79 y.o.   MRN: [id number]  Chief Complaint:  Chief Complaint  Patient presents with  . Medical Managment of Chronic Issues     HPI:   Problem List Items Addressed This Visit   OSTEOARTHRITIS     Pain is better managed with Cymbalta and Norco 5/325 q6hr    Diastolic CHF, acute on chronic     Compensated on Furosemide 60mg  daily, BNP 141.7 10/24/12, bun/crea 29/0.89 11/21/12        HTN (hypertension), malignant     Controlled on Losartan 100mg  and Metoprolol 50mg  bid        Unspecified hypothyroidism     Last TSH 4.613, takes Levothyroxine 37.39mcg, f/u TSH 2.18 11/21/12--4.284 12/14/12        Depression     Stabilized in SNF and Cymbalta 60mg       DNR (do not resuscitate) - Primary      Review of Systems: Review of Systems  Constitutional: Positive for malaise/fatigue (chronic). Negative for fever, chills, weight loss and diaphoresis.  HENT: Positive for hearing loss (severe). Negative for ear pain, congestion and sore throat.   Eyes: Negative for pain and discharge.  Respiratory: Positive for shortness of breath. Negative for cough, sputum production and wheezing.   Cardiovascular: Positive for PND (chronic). Negative for chest pain, palpitations, orthopnea, claudication and leg swelling.  Gastrointestinal: Negative for nausea, vomiting, abdominal pain, diarrhea and constipation.  Genitourinary: Positive for frequency (incontinent of bladder). Negative for dysuria, urgency and flank pain.  Musculoskeletal: Positive for myalgias (chronic), back pain (chronic) and falls.  Skin: Negative for itching and rash.  Neurological: Positive for weakness (generalized). Negative for dizziness, tremors, speech change, seizures, loss of consciousness and headaches.  Endo/Heme/Allergies: Negative for environmental allergies and polydipsia. Does not bruise/bleed easily.  Psychiatric/Behavioral: Positive for depression (flat affect)  and memory loss. Negative for hallucinations. The patient is not nervous/anxious and does not have insomnia.      Medications: Patient's Medications  New Prescriptions   No medications on file  Previous Medications   BRINZOLAMIDE (AZOPT) 1 % OPHTHALMIC SUSPENSION    Place 1 drop into both eyes 2 (two) times daily.    DOCUSATE SODIUM (COLACE) 100 MG CAPSULE    Take 100 mg by mouth 2 (two) times daily.     DULOXETINE (CYMBALTA) 30 MG CAPSULE    Take 60 mg by mouth daily.    FUROSEMIDE (LASIX) 40 MG TABLET    Take 60 mg by mouth daily.   HYDROCODONE-ACETAMINOPHEN (NORCO/VICODIN) 5-325 MG PER TABLET    Take 1 tablet by mouth 4 (four) times daily.   LATANOPROST (XALATAN) 0.005 % OPHTHALMIC SOLUTION    Place 1 drop into both eyes at bedtime.   LEVOTHYROXINE (SYNTHROID, LEVOTHROID) 75 MCG TABLET    Take 37.5 mcg by mouth daily before breakfast.   LOSARTAN (COZAAR) 100 MG TABLET    Take 100 mg by mouth daily.   METOPROLOL (LOPRESSOR) 50 MG TABLET    Take 1 tablet (50 mg total) by mouth 2 (two) times daily.   MULTIPLE VITAMINS-MINERALS (CERTAVITE/ANTIOXIDANTS PO)    Take 1 tablet by mouth daily. Certavite-anitoxidant 18 mg-0.4 mg   Modified Medications   No medications on file  Discontinued Medications   No medications on file     Physical Exam: Physical Exam  Constitutional: She appears well-developed and well-nourished. No distress.  HENT:  Head: Atraumatic.  Eyes: EOM are normal. Pupils are equal, round,  and reactive to light.  Neck: Normal range of motion. Neck supple. No JVD present. No thyromegaly present.  Cardiovascular: Normal rate and regular rhythm.   Murmur heard.  Systolic murmur is present with a grade of 2/6  Pulmonary/Chest: Effort normal. She has no wheezes. She has rales in the left lower field.  Abdominal: Soft. Bowel sounds are normal. There is no tenderness.  Musculoskeletal: Normal range of motion. She exhibits no edema and no tenderness.  Lymphadenopathy:    She  has no cervical adenopathy.  Neurological: She is alert. She has normal reflexes. She displays normal reflexes. No cranial nerve deficit. Coordination normal.  Skin: Skin is warm and dry. No rash noted. She is not diaphoretic.     Filed Vitals:   01/15/13 1530  BP: 122/70  Pulse: 70  Temp: 97.1 F (36.2 C)  TempSrc: Tympanic  Resp: 18      Labs reviewed: Basic Metabolic Panel:  Recent Labs  40/98/11 11/21/12 12/14/12  NA 139 138  --   K 5.0 4.3  --   BUN 22* 29*  --   CREATININE 0.9 0.9  --   TSH  --  2.18 4.28    Liver Function Tests: No results found for this basename: AST, ALT, ALKPHOS, BILITOT, PROT, ALBUMIN,  in the last 8760 hours  CBC:  Recent Labs  09/22/12  WBC 8.9  HGB 12.3  HCT 37  PLT 324    Anemia Panel: No results found for this basename: IRON, FOLATE, VITAMINB12,  in the last 8760 hours  Significant Diagnostic Results:     Assessment/Plan Depression Stabilized in SNF and Cymbalta 60mg     Diastolic CHF, acute on chronic Compensated on Furosemide 60mg  daily, BNP 141.7 10/24/12, bun/crea 29/0.89 11/21/12      HTN (hypertension), malignant Controlled on Losartan 100mg  and Metoprolol 50mg  bid      OSTEOARTHRITIS Pain is better managed with Cymbalta and Norco 5/325 q6hr  Unspecified hypothyroidism Last TSH 4.613, takes Levothyroxine 37.3mcg, f/u TSH 2.18 11/21/12--4.284 12/14/12         Family/ staff Communication: observe the patient.    Goals of care: SNF and Hospice.    Labs/tests ordered none

## 2013-01-15 NOTE — Assessment & Plan Note (Signed)
Pain is better managed with Cymbalta and Norco 5/325 q6hr

## 2013-01-15 NOTE — Assessment & Plan Note (Signed)
Last TSH 4.613, takes Levothyroxine 37.35mcg, f/u TSH 2.18 11/21/12--4.284 12/14/12

## 2013-02-14 ENCOUNTER — Encounter: Payer: Self-pay | Admitting: Nurse Practitioner

## 2013-02-14 ENCOUNTER — Non-Acute Institutional Stay (SKILLED_NURSING_FACILITY): Payer: Medicare Other | Admitting: Nurse Practitioner

## 2013-02-14 DIAGNOSIS — I509 Heart failure, unspecified: Secondary | ICD-10-CM

## 2013-02-14 DIAGNOSIS — E039 Hypothyroidism, unspecified: Secondary | ICD-10-CM

## 2013-02-14 DIAGNOSIS — I10 Essential (primary) hypertension: Secondary | ICD-10-CM

## 2013-02-14 DIAGNOSIS — I5033 Acute on chronic diastolic (congestive) heart failure: Secondary | ICD-10-CM

## 2013-02-14 DIAGNOSIS — M1990 Unspecified osteoarthritis, unspecified site: Secondary | ICD-10-CM

## 2013-02-14 DIAGNOSIS — F329 Major depressive disorder, single episode, unspecified: Secondary | ICD-10-CM

## 2013-02-14 NOTE — Assessment & Plan Note (Signed)
Last TSH 4.613, takes Levothyroxine 37.5mcg, f/u TSH 2.18 11/21/12--4.284 12/14/12     

## 2013-02-14 NOTE — Assessment & Plan Note (Signed)
Pain is better managed with Cymbalta and Norco 5/325 q6hr

## 2013-02-14 NOTE — Assessment & Plan Note (Signed)
Stabilized in SNF and Cymbalta 60mg 

## 2013-02-14 NOTE — Assessment & Plan Note (Signed)
Controlled on Losartan 100mg and Metoprolol 50mg bid   

## 2013-02-14 NOTE — Progress Notes (Signed)
Patient ID: [name], female   DOB: 06-17-1914, 79 y.o.   MRN: [id number]  Chief Complaint:  Chief Complaint  Patient presents with  . Medical Managment of Chronic Issues     HPI:   Problem List Items Addressed This Visit   Depression - Primary     Stabilized in SNF and Cymbalta 60mg         Diastolic CHF, acute on chronic     Compensated on Furosemide 60mg  daily, BNP 141.7 10/24/12, bun/crea 29/0.89 11/21/12-update BMP          HTN (hypertension), malignant     Controlled on Losartan 100mg  and Metoprolol 50mg  bid          OSTEOARTHRITIS     Pain is better managed with Cymbalta and Norco 5/325 q6hr      Unspecified hypothyroidism     Last TSH 4.613, takes Levothyroxine 37.8mcg, f/u TSH 2.18 11/21/12--4.284 12/14/12             Review of Systems:  Review of Systems  Constitutional: Positive for malaise/fatigue (chronic). Negative for fever, chills, weight loss and diaphoresis.  HENT: Positive for hearing loss (severe). Negative for ear pain, congestion and sore throat.   Eyes: Negative for pain and discharge.  Respiratory: Positive for shortness of breath. Negative for cough, sputum production and wheezing.   Cardiovascular: Positive for PND (chronic). Negative for chest pain, palpitations, orthopnea, claudication and leg swelling.  Gastrointestinal: Negative for nausea, vomiting, abdominal pain, diarrhea and constipation.  Genitourinary: Positive for frequency (incontinent of bladder). Negative for dysuria, urgency and flank pain.  Musculoskeletal: Positive for myalgias (chronic), back pain (chronic) and falls.  Skin: Negative for itching and rash.  Neurological: Positive for weakness (generalized). Negative for dizziness, tremors, speech change, seizures, loss of consciousness and headaches.  Endo/Heme/Allergies: Negative for environmental allergies and polydipsia. Does not bruise/bleed easily.  Psychiatric/Behavioral: Positive for depression (flat  affect) and memory loss. Negative for hallucinations. The patient is not nervous/anxious and does not have insomnia.      Medications: Patient's Medications  New Prescriptions   No medications on file  Previous Medications   BRINZOLAMIDE (AZOPT) 1 % OPHTHALMIC SUSPENSION    Place 1 drop into both eyes 2 (two) times daily.    DOCUSATE SODIUM (COLACE) 100 MG CAPSULE    Take 100 mg by mouth 2 (two) times daily.     DULOXETINE (CYMBALTA) 30 MG CAPSULE    Take 60 mg by mouth daily.    FUROSEMIDE (LASIX) 40 MG TABLET    Take 60 mg by mouth daily.   HYDROCODONE-ACETAMINOPHEN (NORCO/VICODIN) 5-325 MG PER TABLET    Take 1 tablet by mouth 4 (four) times daily.   LATANOPROST (XALATAN) 0.005 % OPHTHALMIC SOLUTION    Place 1 drop into both eyes at bedtime.   LEVOTHYROXINE (SYNTHROID, LEVOTHROID) 75 MCG TABLET    Take 37.5 mcg by mouth daily before breakfast.   LOSARTAN (COZAAR) 100 MG TABLET    Take 100 mg by mouth daily.   METOPROLOL (LOPRESSOR) 50 MG TABLET    Take 1 tablet (50 mg total) by mouth 2 (two) times daily.   MULTIPLE VITAMINS-MINERALS (CERTAVITE/ANTIOXIDANTS PO)    Take 1 tablet by mouth daily. Certavite-anitoxidant 18 mg-0.4 mg   Modified Medications   No medications on file  Discontinued Medications   No medications on file     Physical Exam: Physical Exam  Constitutional: She appears well-developed and well-nourished. No distress.  HENT:  Head: Atraumatic.  Eyes: EOM  are normal. Pupils are equal, round, and reactive to light.  Neck: Normal range of motion. Neck supple. No JVD present. No thyromegaly present.  Cardiovascular: Normal rate and regular rhythm.   Murmur heard.  Systolic murmur is present with a grade of 2/6  Pulmonary/Chest: Effort normal. She has no wheezes. She has rales in the left lower field.  Abdominal: Soft. Bowel sounds are normal. There is no tenderness.  Musculoskeletal: Normal range of motion. She exhibits no edema and no tenderness.  Lymphadenopathy:     She has no cervical adenopathy.  Neurological: She is alert. She has normal reflexes. She displays normal reflexes. No cranial nerve deficit. Coordination normal.  Skin: Skin is warm and dry. No rash noted. She is not diaphoretic.     Filed Vitals:   02/14/13 1447  BP: 130/75  Pulse: 60  Resp: 24      Labs reviewed: Basic Metabolic Panel:  Recent Labs  54/09/81 11/21/12 12/14/12  NA 139 138  --   K 5.0 4.3  --   BUN 22* 29*  --   CREATININE 0.9 0.9  --   TSH  --  2.18 4.28    Liver Function Tests: No results found for this basename: AST, ALT, ALKPHOS, BILITOT, PROT, ALBUMIN,  in the last 8760 hours  CBC:  Recent Labs  09/22/12  WBC 8.9  HGB 12.3  HCT 37  PLT 324    Anemia Panel: No results found for this basename: IRON, FOLATE, VITAMINB12,  in the last 8760 hours  Significant Diagnostic Results:     Assessment/Plan Depression Stabilized in SNF and Cymbalta 60mg       Diastolic CHF, acute on chronic Compensated on Furosemide 60mg  daily, BNP 141.7 10/24/12, bun/crea 29/0.89 11/21/12-update BMP        HTN (hypertension), malignant Controlled on Losartan 100mg  and Metoprolol 50mg  bid        Unspecified hypothyroidism Last TSH 4.613, takes Levothyroxine 37.35mcg, f/u TSH 2.18 11/21/12--4.284 12/14/12        OSTEOARTHRITIS Pain is better managed with Cymbalta and Norco 5/325 q6hr        Family/ staff Communication: observe the patient   Goals of care: SNF Hospice    Labs/tests ordered BMP

## 2013-02-14 NOTE — Assessment & Plan Note (Signed)
Compensated on Furosemide 60mg  daily, BNP 141.7 10/24/12, bun/crea 29/0.89 11/21/12-update BMP

## 2013-02-15 LAB — BASIC METABOLIC PANEL
BUN: 15 mg/dL (ref 4–21)
Glucose: 129 mg/dL
Potassium: 4.3 mmol/L (ref 3.4–5.3)

## 2013-03-19 ENCOUNTER — Non-Acute Institutional Stay (SKILLED_NURSING_FACILITY): Payer: Medicare Other | Admitting: Nurse Practitioner

## 2013-03-19 ENCOUNTER — Encounter: Payer: Self-pay | Admitting: Nurse Practitioner

## 2013-03-19 DIAGNOSIS — I10 Essential (primary) hypertension: Secondary | ICD-10-CM

## 2013-03-19 DIAGNOSIS — I509 Heart failure, unspecified: Secondary | ICD-10-CM

## 2013-03-19 DIAGNOSIS — E039 Hypothyroidism, unspecified: Secondary | ICD-10-CM

## 2013-03-19 DIAGNOSIS — I5033 Acute on chronic diastolic (congestive) heart failure: Secondary | ICD-10-CM

## 2013-03-19 DIAGNOSIS — F329 Major depressive disorder, single episode, unspecified: Secondary | ICD-10-CM

## 2013-03-19 DIAGNOSIS — M1990 Unspecified osteoarthritis, unspecified site: Secondary | ICD-10-CM

## 2013-03-19 NOTE — Assessment & Plan Note (Addendum)
Compensated on Furosemide 60mg daily, BNP 141.7 10/24/12, bun/crea 15/0.83 02/27/13         

## 2013-03-19 NOTE — Assessment & Plan Note (Signed)
Pain is better managed with Cymbalta and Norco 5/325 q6hr 

## 2013-03-19 NOTE — Progress Notes (Signed)
Patient ID: [name], female   DOB: 1914-01-18, 79 y.o.   MRN: [id number] Code Status: DNR  Allergies  Allergen Reactions  . Aspirin Nausea Only  . Atorvastatin Nausea Only  . Benazepril     Not known  . Clopidogrel Bisulfate   . Flexeril (Cyclobenzaprine Hcl)   . Guanfacine     Not known.  Marland Kitchen Hctz (Hydrochlorothiazide)     Not known  . Ramipril     REACTION: cough    Chief Complaint  Patient presents with  . Medical Managment of Chronic Issues    HPI: Patient is a 79 y.o. female seen in the SNF at [facility] today for evaluation of her chronic medical conditions.  Problem List Items Addressed This Visit   Depression - Primary     Stabilized in SNF and Cymbalta 60mg           Diastolic CHF, acute on chronic     Compensated on Furosemide 60mg  daily, BNP 141.7 10/24/12, bun/crea 15/0.83 02/27/13          HTN (hypertension), malignant     Controlled on Losartan 100mg  and Metoprolol 50mg  bid            OSTEOARTHRITIS     Pain is better managed with Cymbalta and Norco 5/325 q6hr        Unspecified hypothyroidism     Last TSH 4.613, takes Levothyroxine 37.48mcg, f/u TSH 2.18 11/21/12--4.284 12/14/12               Review of Systems:  Review of Systems  Constitutional: Positive for malaise/fatigue (chronic). Negative for fever, chills, weight loss and diaphoresis.  HENT: Positive for hearing loss (severe). Negative for ear pain, congestion and sore throat.   Eyes: Negative for pain and discharge.  Respiratory: Positive for shortness of breath. Negative for cough, sputum production and wheezing.   Cardiovascular: Positive for PND (chronic). Negative for chest pain, palpitations, orthopnea, claudication and leg swelling.  Gastrointestinal: Negative for nausea, vomiting, abdominal pain, diarrhea and constipation.  Genitourinary: Positive for frequency (incontinent of bladder). Negative for dysuria, urgency and flank pain.   Musculoskeletal: Positive for myalgias (chronic), back pain (chronic) and falls.  Skin: Negative for itching and rash.  Neurological: Positive for weakness (generalized). Negative for dizziness, tremors, speech change, seizures, loss of consciousness and headaches.  Endo/Heme/Allergies: Negative for environmental allergies and polydipsia. Does not bruise/bleed easily.  Psychiatric/Behavioral: Positive for depression (flat affect) and memory loss. Negative for hallucinations. The patient is not nervous/anxious and does not have insomnia.      Past Medical History  Diagnosis Date  . Osteoarthritis   . Dyslipidemia   . Carotid stenosis   . Hypertension   . CAD (coronary artery disease)   . Glaucoma   . Hearing loss   . Carcinoma     basal cell  . Cataract   . Anemia   . Macular degeneration   . Other abnormal glucose 2003  . Shortness of breath   . Myocardial infarction   . Diastolic CHF, acute on chronic 06/24/2011   Past Surgical History  Procedure Laterality Date  . Cataract extraction    . Tonsillectomy    . Non-stemi  2001    w/ a successful PTCA  . Stent of rca      unsuccessful PTCA of left circumflex  . Adenoidectomy    . Tonsillectomy    . Eye surgery      cataracts B eyes   Social History:  reports that she has never smoked. She does not have any smokeless tobacco history on file. She reports that she does not drink alcohol or use illicit drugs.  Family History  Problem Relation Age of Onset  . Coronary artery disease    . Breast cancer Daughter   . Stroke Mother   . Heart attack Father     Medications: Patient's Medications  New Prescriptions   No medications on file  Previous Medications   BRINZOLAMIDE (AZOPT) 1 % OPHTHALMIC SUSPENSION    Place 1 drop into both eyes 2 (two) times daily.    DOCUSATE SODIUM (COLACE) 100 MG CAPSULE    Take 100 mg by mouth 2 (two) times daily.     DULOXETINE (CYMBALTA) 30 MG CAPSULE    Take 60 mg by mouth daily.     FUROSEMIDE (LASIX) 40 MG TABLET    Take 60 mg by mouth daily.   HYDROCODONE-ACETAMINOPHEN (NORCO/VICODIN) 5-325 MG PER TABLET    Take 1 tablet by mouth 4 (four) times daily.   LATANOPROST (XALATAN) 0.005 % OPHTHALMIC SOLUTION    Place 1 drop into both eyes at bedtime.   LEVOTHYROXINE (SYNTHROID, LEVOTHROID) 75 MCG TABLET    Take 37.5 mcg by mouth daily before breakfast.   LOSARTAN (COZAAR) 100 MG TABLET    Take 100 mg by mouth daily.   METOPROLOL (LOPRESSOR) 50 MG TABLET    Take 1 tablet (50 mg total) by mouth 2 (two) times daily.   MULTIPLE VITAMINS-MINERALS (CERTAVITE/ANTIOXIDANTS PO)    Take 1 tablet by mouth daily. Certavite-anitoxidant 18 mg-0.4 mg   Modified Medications   No medications on file  Discontinued Medications   No medications on file     Physical Exam: Physical Exam  Constitutional: She appears well-developed and well-nourished. No distress.  HENT:  Head: Atraumatic.  Eyes: EOM are normal. Pupils are equal, round, and reactive to light.  Neck: Normal range of motion. Neck supple. No JVD present. No thyromegaly present.  Cardiovascular: Normal rate and regular rhythm.   Murmur heard.  Systolic murmur is present with a grade of 2/6  Pulmonary/Chest: Effort normal. She has no wheezes. She has rales in the left lower field.  Abdominal: Soft. Bowel sounds are normal. There is no tenderness.  Musculoskeletal: Normal range of motion. She exhibits no edema and no tenderness.  Lymphadenopathy:    She has no cervical adenopathy.  Neurological: She is alert. She has normal reflexes. No cranial nerve deficit. Coordination normal.  Skin: Skin is warm and dry. No rash noted. She is not diaphoretic.    Filed Vitals:   03/19/13 1253  BP: 130/70  Pulse: 62  Temp: 98.3 F (36.8 C)  TempSrc: Tympanic  Resp: 18      Labs reviewed: Basic Metabolic Panel:  Recent Labs  08/65/78 11/21/12 12/14/12 02/15/13  NA 139 138  --  137  K 5.0 4.3  --  4.3  BUN 22* 29*  --  15   CREATININE 0.9 0.9  --  0.8  TSH  --  2.18 4.28  --    Liver Function Tests: No results found for this basename: AST, ALT, ALKPHOS, BILITOT, PROT, ALBUMIN,  in the last 8760 hours No results found for this basename: LIPASE, AMYLASE,  in the last 8760 hours No results found for this basename: AMMONIA,  in the last 8760 hours CBC:  Recent Labs  09/22/12  WBC 8.9  HGB 12.3  HCT 37  PLT 324   Lipid Panel:  No results found for this basename: CHOL, HDL, LDLCALC, TRIG, CHOLHDL, LDLDIRECT,  in the last 8760 hours Anemia Panel: No results found for this basename: FOLATE, IRON, VITAMINB12,  in the last 8760 hours  Past Procedures: 10/17/12 CXR CHF and right effusion.     Assessment/Plan Depression Stabilized in SNF and Cymbalta 60mg         Diastolic CHF, acute on chronic Compensated on Furosemide 60mg  daily, BNP 141.7 10/24/12, bun/crea 15/0.83 02/27/13        HTN (hypertension), malignant Controlled on Losartan 100mg  and Metoprolol 50mg  bid          Unspecified hypothyroidism Last TSH 4.613, takes Levothyroxine 37.73mcg, f/u TSH 2.18 11/21/12--4.284 12/14/12          OSTEOARTHRITIS Pain is better managed with Cymbalta and Norco 5/325 q6hr        Family/ Staff Communication: none  Goals of Care: SNF  Labs/tests ordered: none

## 2013-03-19 NOTE — Assessment & Plan Note (Signed)
Stabilized in SNF and Cymbalta 60mg 

## 2013-03-19 NOTE — Assessment & Plan Note (Signed)
Controlled on Losartan 100mg and Metoprolol 50mg bid   

## 2013-03-19 NOTE — Assessment & Plan Note (Signed)
Last TSH 4.613, takes Levothyroxine 37.5mcg, f/u TSH 2.18 11/21/12--4.284 12/14/12     

## 2013-03-28 ENCOUNTER — Non-Acute Institutional Stay (SKILLED_NURSING_FACILITY): Payer: Medicare Other | Admitting: Nurse Practitioner

## 2013-03-28 ENCOUNTER — Encounter: Payer: Self-pay | Admitting: Nurse Practitioner

## 2013-03-28 DIAGNOSIS — I10 Essential (primary) hypertension: Secondary | ICD-10-CM

## 2013-03-28 DIAGNOSIS — I5033 Acute on chronic diastolic (congestive) heart failure: Secondary | ICD-10-CM

## 2013-03-28 DIAGNOSIS — M1990 Unspecified osteoarthritis, unspecified site: Secondary | ICD-10-CM

## 2013-03-28 DIAGNOSIS — F329 Major depressive disorder, single episode, unspecified: Secondary | ICD-10-CM

## 2013-03-28 DIAGNOSIS — E039 Hypothyroidism, unspecified: Secondary | ICD-10-CM

## 2013-03-28 DIAGNOSIS — K5900 Constipation, unspecified: Secondary | ICD-10-CM | POA: Insufficient documentation

## 2013-03-28 DIAGNOSIS — I509 Heart failure, unspecified: Secondary | ICD-10-CM

## 2013-03-28 NOTE — Assessment & Plan Note (Signed)
Last TSH 4.613, takes Levothyroxine 37.5mcg, f/u TSH 2.18 11/21/12--4.284 12/14/12     

## 2013-03-28 NOTE — Assessment & Plan Note (Signed)
Takes Norco qid for pain control.   

## 2013-03-28 NOTE — Assessment & Plan Note (Signed)
Controlled on Losartan 100mg and Metoprolol 50mg bid   

## 2013-03-28 NOTE — Assessment & Plan Note (Signed)
Compensated on Furosemide 60mg  daily, BNP 141.7 10/24/12, bun/crea 15/0.83 02/27/13

## 2013-03-28 NOTE — Progress Notes (Signed)
Patient ID: [name], female   DOB: [birth year], 79 y.o.   MRN: [id number] Code Status: DNR  Allergies  Allergen Reactions  . Aspirin Nausea Only  . Atorvastatin Nausea Only  . Benazepril     Not known  . Clopidogrel Bisulfate   . Flexeril [Cyclobenzaprine Hcl]   . Guanfacine     Not known.  Marland Kitchen Hctz [Hydrochlorothiazide]     Not known  . Ramipril     REACTION: cough    Chief Complaint  Patient presents with  . Medical Managment of Chronic Issues    HPI: Patient is a 79 y.o. female seen in the SNF at [facility] today for evaluation of her chronic medical conditions.  Problem List Items Addressed This Visit   Depression - Primary     Stabilized in SNF and Cymbalta 60mg . She stated that she doesn't sleep well at night but declined sleeping aid.             Diastolic CHF, acute on chronic     Compensated on Furosemide 60mg  daily, BNP 141.7 10/24/12, bun/crea 15/0.83 02/27/13            HTN (hypertension), malignant     Controlled on Losartan 100mg  and Metoprolol 50mg  bid              OSTEOARTHRITIS     Takes Norco qid for pain control.     Unspecified constipation     Stable on Colace 100mg  bid.     Unspecified hypothyroidism     Last TSH 4.613, takes Levothyroxine 37.35mcg, f/u TSH 2.18 11/21/12--4.284 12/14/12               Review of Systems:  Review of Systems  Constitutional: Positive for malaise/fatigue (chronic). Negative for fever, chills, weight loss and diaphoresis.  HENT: Positive for hearing loss (severe). Negative for ear pain, congestion and sore throat.   Eyes: Negative for pain and discharge.  Respiratory: Positive for shortness of breath. Negative for cough, sputum production and wheezing.   Cardiovascular: Positive for PND (chronic). Negative for chest pain, palpitations, orthopnea, claudication and leg swelling.  Gastrointestinal: Negative for nausea, vomiting, abdominal pain, diarrhea and constipation.   Genitourinary: Positive for frequency (incontinent of bladder). Negative for dysuria, urgency and flank pain.  Musculoskeletal: Positive for myalgias (chronic), back pain (chronic) and falls.  Skin: Negative for itching and rash.  Neurological: Positive for weakness (generalized). Negative for dizziness, tremors, speech change, seizures, loss of consciousness and headaches.  Endo/Heme/Allergies: Negative for environmental allergies and polydipsia. Does not bruise/bleed easily.  Psychiatric/Behavioral: Positive for depression (flat affect) and memory loss. Negative for hallucinations. The patient is not nervous/anxious and does not have insomnia.      Past Medical History  Diagnosis Date  . Osteoarthritis   . Dyslipidemia   . Carotid stenosis   . Hypertension   . CAD (coronary artery disease)   . Glaucoma   . Hearing loss   . Carcinoma     basal cell  . Cataract   . Anemia   . Macular degeneration   . Other abnormal glucose 2003  . Shortness of breath   . Myocardial infarction   . Diastolic CHF, acute on chronic 06/24/2011   Past Surgical History  Procedure Laterality Date  . Cataract extraction    . Tonsillectomy    . Non-stemi  2001    w/ a successful PTCA  . Stent of rca      unsuccessful PTCA of  left circumflex  . Adenoidectomy    . Tonsillectomy    . Eye surgery      cataracts B eyes   Social History:   reports that she has never smoked. She does not have any smokeless tobacco history on file. She reports that she does not drink alcohol or use illicit drugs.  Family History  Problem Relation Age of Onset  . Coronary artery disease    . Breast cancer Daughter   . Stroke Mother   . Heart attack Father     Medications: Patient's Medications  New Prescriptions   No medications on file  Previous Medications   BRINZOLAMIDE (AZOPT) 1 % OPHTHALMIC SUSPENSION    Place 1 drop into both eyes 2 (two) times daily.    DOCUSATE SODIUM (COLACE) 100 MG CAPSULE    Take  100 mg by mouth 2 (two) times daily.     DULOXETINE (CYMBALTA) 30 MG CAPSULE    Take 60 mg by mouth daily.    FUROSEMIDE (LASIX) 40 MG TABLET    Take 60 mg by mouth daily.   HYDROCODONE-ACETAMINOPHEN (NORCO/VICODIN) 5-325 MG PER TABLET    Take 1 tablet by mouth 4 (four) times daily.   LATANOPROST (XALATAN) 0.005 % OPHTHALMIC SOLUTION    Place 1 drop into both eyes at bedtime.   LEVOTHYROXINE (SYNTHROID, LEVOTHROID) 75 MCG TABLET    Take 37.5 mcg by mouth daily before breakfast.   LOSARTAN (COZAAR) 100 MG TABLET    Take 100 mg by mouth daily.   METOPROLOL (LOPRESSOR) 50 MG TABLET    Take 1 tablet (50 mg total) by mouth 2 (two) times daily.   MULTIPLE VITAMINS-MINERALS (CERTAVITE/ANTIOXIDANTS PO)    Take 1 tablet by mouth daily. Certavite-anitoxidant 18 mg-0.4 mg   Modified Medications   No medications on file  Discontinued Medications   No medications on file     Physical Exam: Physical Exam  Constitutional: She appears well-developed and well-nourished. No distress.  HENT:  Head: Atraumatic.  Eyes: EOM are normal. Pupils are equal, round, and reactive to light.  Neck: Normal range of motion. Neck supple. No JVD present. No thyromegaly present.  Cardiovascular: Normal rate and regular rhythm.   Murmur heard.  Systolic murmur is present with a grade of 2/6  Pulmonary/Chest: Effort normal. She has no wheezes. She has rales in the left lower field.  Abdominal: Soft. Bowel sounds are normal. There is no tenderness.  Musculoskeletal: Normal range of motion. She exhibits no edema and no tenderness.  Lymphadenopathy:    She has no cervical adenopathy.  Neurological: She is alert. She has normal reflexes. No cranial nerve deficit. Coordination normal.  Skin: Skin is warm and dry. No rash noted. She is not diaphoretic.    Filed Vitals:   03/28/13 1311  BP: 130/70  Pulse: 62  Temp: 98.8 F (37.1 C)  TempSrc: Tympanic  Resp: 18      Labs reviewed: Basic Metabolic Panel:  Recent  Labs  10/24/12 11/21/12 12/14/12 02/15/13  NA 139 138  --  137  K 5.0 4.3  --  4.3  BUN 22* 29*  --  15  CREATININE 0.9 0.9  --  0.8  TSH  --  2.18 4.28  --    CBC:  Recent Labs  09/22/12  WBC 8.9  HGB 12.3  HCT 37  PLT 324    Past Procedures: 10/17/12 CXR CHF and right effusion.     Assessment/Plan Depression Stabilized in SNF and Cymbalta  60mg . She stated that she doesn't sleep well at night but declined sleeping aid.           Diastolic CHF, acute on chronic Compensated on Furosemide 60mg  daily, BNP 141.7 10/24/12, bun/crea 15/0.83 02/27/13          Unspecified hypothyroidism Last TSH 4.613, takes Levothyroxine 37.86mcg, f/u TSH 2.18 11/21/12--4.284 12/14/12          HTN (hypertension), malignant Controlled on Losartan 100mg  and Metoprolol 50mg  bid            Unspecified constipation Stable on Colace 100mg  bid.   OSTEOARTHRITIS Takes Norco qid for pain control.     Family/ Staff Communication: none  Goals of Care: SNF  Labs/tests ordered: none

## 2013-03-28 NOTE — Assessment & Plan Note (Addendum)
Stabilized in SNF and Cymbalta 60mg. She stated that she doesn't sleep well at night but declined sleeping aid.    

## 2013-03-28 NOTE — Assessment & Plan Note (Signed)
Stable on Colace 100mg bid   

## 2013-04-02 ENCOUNTER — Other Ambulatory Visit: Payer: Self-pay | Admitting: Geriatric Medicine

## 2013-04-02 MED ORDER — HYDROCODONE-ACETAMINOPHEN 5-325 MG PO TABS: 1.0000 | ORAL_TABLET | Freq: Four times a day (QID) | ORAL | Status: DC

## 2013-04-06 DIAGNOSIS — Z299 Encounter for prophylactic measures, unspecified: Secondary | ICD-10-CM

## 2013-04-30 ENCOUNTER — Non-Acute Institutional Stay (SKILLED_NURSING_FACILITY): Payer: Medicare Other | Admitting: Nurse Practitioner

## 2013-04-30 DIAGNOSIS — F329 Major depressive disorder, single episode, unspecified: Secondary | ICD-10-CM

## 2013-04-30 DIAGNOSIS — E039 Hypothyroidism, unspecified: Secondary | ICD-10-CM

## 2013-04-30 DIAGNOSIS — M1990 Unspecified osteoarthritis, unspecified site: Secondary | ICD-10-CM

## 2013-04-30 DIAGNOSIS — I509 Heart failure, unspecified: Secondary | ICD-10-CM

## 2013-04-30 DIAGNOSIS — I10 Essential (primary) hypertension: Secondary | ICD-10-CM

## 2013-04-30 DIAGNOSIS — I5033 Acute on chronic diastolic (congestive) heart failure: Secondary | ICD-10-CM

## 2013-05-02 ENCOUNTER — Encounter: Payer: Self-pay | Admitting: Nurse Practitioner

## 2013-05-02 NOTE — Assessment & Plan Note (Signed)
Compensated on Furosemide 60mg daily, BNP 141.7 10/24/12, bun/crea 15/0.83 02/27/13         

## 2013-05-02 NOTE — Assessment & Plan Note (Signed)
Controlled on Losartan 100mg and Metoprolol 50mg bid   

## 2013-05-02 NOTE — Assessment & Plan Note (Signed)
Stabilized in SNF and Cymbalta 60mg. She stated that she doesn't sleep well at night but declined sleeping aid.    

## 2013-05-02 NOTE — Progress Notes (Signed)
Patient ID: [name], female   DOB: 1914/07/31, 79 y.o.   MRN: [id number] Code Status: DNR  Allergies  Allergen Reactions  . Aspirin Nausea Only  . Atorvastatin Nausea Only  . Benazepril     Not known  . Clopidogrel Bisulfate   . Flexeril [Cyclobenzaprine Hcl]   . Guanfacine     Not known.  Marland Kitchen Hctz [Hydrochlorothiazide]     Not known  . Ramipril     REACTION: cough    Chief Complaint  Patient presents with  . Medical Managment of Chronic Issues    HPI: Patient is a 79 y.o. female seen in the SNF at [facility] today for evaluation of her chronic medical conditions.  Problem List Items Addressed This Visit   Depression     Stabilized in SNF and Cymbalta 60mg . She stated that she doesn't sleep well at night but declined sleeping aid.               Diastolic CHF, acute on chronic - Primary     Compensated on Furosemide 60mg  daily, BNP 141.7 10/24/12, bun/crea 15/0.83 02/27/13              HYPERTENSION     Controlled on Losartan 100mg  and Metoprolol 50mg  bid                OSTEOARTHRITIS     Takes Norco qid for pain control.       Unspecified hypothyroidism     Last TSH 4.613, takes Levothyroxine 37.73mcg, f/u TSH 2.18 11/21/12--4.284 12/14/12                 Review of Systems:  Review of Systems  Constitutional: Positive for malaise/fatigue (chronic). Negative for fever, chills, weight loss and diaphoresis.  HENT: Positive for hearing loss (severe). Negative for ear pain, congestion and sore throat.   Eyes: Negative for pain and discharge.  Respiratory: Positive for shortness of breath. Negative for cough, sputum production and wheezing.   Cardiovascular: Positive for PND (chronic). Negative for chest pain, palpitations, orthopnea, claudication and leg swelling.  Gastrointestinal: Negative for nausea, vomiting, abdominal pain, diarrhea and constipation.  Genitourinary: Positive for frequency (incontinent of bladder).  Negative for dysuria, urgency and flank pain.  Musculoskeletal: Positive for myalgias (chronic), back pain (chronic) and falls.  Skin: Negative for itching and rash.  Neurological: Positive for weakness (generalized). Negative for dizziness, tremors, speech change, seizures, loss of consciousness and headaches.  Endo/Heme/Allergies: Negative for environmental allergies and polydipsia. Does not bruise/bleed easily.  Psychiatric/Behavioral: Positive for depression (flat affect) and memory loss. Negative for hallucinations. The patient is not nervous/anxious and does not have insomnia.      Past Medical History  Diagnosis Date  . Osteoarthritis   . Dyslipidemia   . Carotid stenosis   . Hypertension   . CAD (coronary artery disease)   . Glaucoma   . Hearing loss   . Carcinoma     basal cell  . Cataract   . Anemia   . Macular degeneration   . Other abnormal glucose 2003  . Shortness of breath   . Myocardial infarction   . Diastolic CHF, acute on chronic 06/24/2011   Past Surgical History  Procedure Laterality Date  . Cataract extraction    . Tonsillectomy    . Non-stemi  2001    w/ a successful PTCA  . Stent of rca      unsuccessful PTCA of left circumflex  . Adenoidectomy    .  Tonsillectomy    . Eye surgery      cataracts B eyes   Social History:   reports that she has never smoked. She does not have any smokeless tobacco history on file. She reports that she does not drink alcohol or use illicit drugs.  Family History  Problem Relation Age of Onset  . Coronary artery disease    . Breast cancer Daughter   . Stroke Mother   . Heart attack Father     Medications: Patient's Medications  New Prescriptions   No medications on file  Previous Medications   BRINZOLAMIDE (AZOPT) 1 % OPHTHALMIC SUSPENSION    Place 1 drop into both eyes 2 (two) times daily.    DOCUSATE SODIUM (COLACE) 100 MG CAPSULE    Take 100 mg by mouth 2 (two) times daily.     DULOXETINE (CYMBALTA) 30  MG CAPSULE    Take 60 mg by mouth daily.    FUROSEMIDE (LASIX) 40 MG TABLET    Take 60 mg by mouth daily.   HYDROCODONE-ACETAMINOPHEN (NORCO/VICODIN) 5-325 MG PER TABLET    Take 1 tablet by mouth 4 (four) times daily.   LATANOPROST (XALATAN) 0.005 % OPHTHALMIC SOLUTION    Place 1 drop into both eyes at bedtime.   LEVOTHYROXINE (SYNTHROID, LEVOTHROID) 75 MCG TABLET    Take 37.5 mcg by mouth daily before breakfast.   LOSARTAN (COZAAR) 100 MG TABLET    Take 100 mg by mouth daily.   METOPROLOL (LOPRESSOR) 50 MG TABLET    Take 1 tablet (50 mg total) by mouth 2 (two) times daily.   MULTIPLE VITAMINS-MINERALS (CERTAVITE/ANTIOXIDANTS PO)    Take 1 tablet by mouth daily. Certavite-anitoxidant 18 mg-0.4 mg   Modified Medications   No medications on file  Discontinued Medications   No medications on file     Physical Exam: Physical Exam  Constitutional: She appears well-developed and well-nourished. No distress.  HENT:  Head: Atraumatic.  Eyes: EOM are normal. Pupils are equal, round, and reactive to light.  Neck: Normal range of motion. Neck supple. No JVD present. No thyromegaly present.  Cardiovascular: Normal rate and regular rhythm.   Murmur heard.  Systolic murmur is present with a grade of 2/6  Pulmonary/Chest: Effort normal. She has no wheezes. She has rales in the left lower field.  Abdominal: Soft. Bowel sounds are normal. There is no tenderness.  Musculoskeletal: Normal range of motion. She exhibits no edema and no tenderness.  Lymphadenopathy:    She has no cervical adenopathy.  Neurological: She is alert. She has normal reflexes. No cranial nerve deficit. Coordination normal.  Skin: Skin is warm and dry. No rash noted. She is not diaphoretic.    Filed Vitals:   05/02/13 1018  BP: 132/60  Pulse: 60  Temp: 98.5 F (36.9 C)  TempSrc: Tympanic  Resp: 18      Labs reviewed: Basic Metabolic Panel:  Recent Labs  16/10/96 11/21/12 12/14/12 02/15/13  NA 139 138  --  137   K 5.0 4.3  --  4.3  BUN 22* 29*  --  15  CREATININE 0.9 0.9  --  0.8  TSH  --  2.18 4.28  --    CBC:  Recent Labs  09/22/12  WBC 8.9  HGB 12.3  HCT 37  PLT 324    Past Procedures: 10/17/12 CXR CHF and right effusion.     Assessment/Plan Diastolic CHF, acute on chronic Compensated on Furosemide 60mg  daily, BNP 141.7 10/24/12, bun/crea 15/0.83  02/27/13            Unspecified hypothyroidism Last TSH 4.613, takes Levothyroxine 37.50mcg, f/u TSH 2.18 11/21/12--4.284 12/14/12            HYPERTENSION Controlled on Losartan 100mg  and Metoprolol 50mg  bid              OSTEOARTHRITIS Takes Norco qid for pain control.     Depression Stabilized in SNF and Cymbalta 60mg . She stated that she doesn't sleep well at night but declined sleeping aid.               Family/ Staff Communication: none  Goals of Care: SNF  Labs/tests ordered: none

## 2013-05-02 NOTE — Assessment & Plan Note (Signed)
Last TSH 4.613, takes Levothyroxine 37.29mcg, f/u TSH 2.18 11/21/12--4.284 12/14/12

## 2013-05-02 NOTE — Assessment & Plan Note (Signed)
Takes Norco qid for pain control.   

## 2013-05-28 ENCOUNTER — Encounter: Payer: Self-pay | Admitting: *Deleted

## 2013-05-30 ENCOUNTER — Encounter: Payer: Self-pay | Admitting: Nurse Practitioner

## 2013-05-30 ENCOUNTER — Non-Acute Institutional Stay (SKILLED_NURSING_FACILITY): Payer: Medicare Other | Admitting: Nurse Practitioner

## 2013-05-30 DIAGNOSIS — F329 Major depressive disorder, single episode, unspecified: Secondary | ICD-10-CM

## 2013-05-30 DIAGNOSIS — D649 Anemia, unspecified: Secondary | ICD-10-CM

## 2013-05-30 DIAGNOSIS — I509 Heart failure, unspecified: Secondary | ICD-10-CM

## 2013-05-30 DIAGNOSIS — I10 Essential (primary) hypertension: Secondary | ICD-10-CM

## 2013-05-30 DIAGNOSIS — M1990 Unspecified osteoarthritis, unspecified site: Secondary | ICD-10-CM

## 2013-05-30 DIAGNOSIS — E039 Hypothyroidism, unspecified: Secondary | ICD-10-CM

## 2013-05-30 DIAGNOSIS — I5033 Acute on chronic diastolic (congestive) heart failure: Secondary | ICD-10-CM

## 2013-05-30 NOTE — Assessment & Plan Note (Signed)
Takes Norco qid for pain control.   

## 2013-05-30 NOTE — Assessment & Plan Note (Signed)
Last TSH 4.613, takes Levothyroxine 37.68mcg, f/u TSH 2.18 11/21/12--4.284 12/14/12--update TSH

## 2013-05-30 NOTE — Assessment & Plan Note (Signed)
Compensated on Furosemide 60mg  daily, BNP 141.7 10/24/12, bun/crea 15/0.83 02/27/13-update CMP

## 2013-05-30 NOTE — Assessment & Plan Note (Signed)
Stabilized in SNF and Cymbalta 60mg. She stated that she doesn't sleep well at night but declined sleeping aid.    

## 2013-05-30 NOTE — Progress Notes (Signed)
Patient ID: [name], female   DOB: Jun 26, 1914, 79 y.o.   MRN: [id number]  Code Status: DNR  Allergies  Allergen Reactions  . Aspirin Nausea Only  . Atorvastatin Nausea Only  . Benazepril     Not known  . Clopidogrel Bisulfate   . Flexeril [Cyclobenzaprine Hcl]   . Guanfacine     Not known.  Marland Kitchen Hctz [Hydrochlorothiazide]     Not known  . Ramipril     REACTION: cough    Chief Complaint  Patient presents with  . Medical Managment of Chronic Issues    HPI: Patient is a 79 y.o. female seen in the SNF at [facility] today for evaluation of her chronic medical conditions.  Problem List Items Addressed This Visit   Anemia (Chronic)     Update CBC    Depression - Primary     Stabilized in SNF and Cymbalta 60mg . She stated that she doesn't sleep well at night but declined sleeping aid.                 Diastolic CHF, acute on chronic     Compensated on Furosemide 60mg  daily, BNP 141.7 10/24/12, bun/crea 15/0.83 02/27/13-update CMP                HYPERTENSION     Controlled on Losartan 100mg  and Metoprolol 50mg  bid                OSTEOARTHRITIS     Takes Norco qid for pain control.         Unspecified hypothyroidism     Last TSH 4.613, takes Levothyroxine 37.41mcg, f/u TSH 2.18 11/21/12--4.284 12/14/12--update TSH                   Review of Systems:  Review of Systems  Constitutional: Positive for malaise/fatigue (chronic). Negative for fever, chills, weight loss and diaphoresis.  HENT: Positive for hearing loss (severe). Negative for congestion, ear pain and sore throat.   Eyes: Negative for pain and discharge.  Respiratory: Positive for shortness of breath. Negative for cough, sputum production and wheezing.   Cardiovascular: Positive for PND (chronic). Negative for chest pain, palpitations, orthopnea, claudication and leg swelling.  Gastrointestinal: Negative for nausea, vomiting, abdominal pain, diarrhea and  constipation.  Genitourinary: Positive for frequency (incontinent of bladder). Negative for dysuria, urgency and flank pain.  Musculoskeletal: Positive for back pain (chronic), falls and myalgias (chronic).  Skin: Negative for itching and rash.  Neurological: Positive for weakness (generalized). Negative for dizziness, tremors, speech change, seizures, loss of consciousness and headaches.  Endo/Heme/Allergies: Negative for environmental allergies and polydipsia. Does not bruise/bleed easily.  Psychiatric/Behavioral: Positive for depression (flat affect) and memory loss. Negative for hallucinations. The patient is not nervous/anxious and does not have insomnia.      Past Medical History  Diagnosis Date  . Osteoarthritis   . Dyslipidemia   . Carotid stenosis   . Hypertension   . CAD (coronary artery disease)   . Glaucoma   . Hearing loss   . Carcinoma     basal cell  . Cataract   . Anemia   . Macular degeneration   . Other abnormal glucose 2003  . Shortness of breath   . Myocardial infarction   . Diastolic CHF, acute on chronic 06/24/2011   Past Surgical History  Procedure Laterality Date  . Cataract extraction    . Tonsillectomy    . Non-stemi  2001    w/ a  successful PTCA  . Stent of rca      unsuccessful PTCA of left circumflex  . Adenoidectomy    . Tonsillectomy    . Eye surgery      cataracts B eyes   Social History:   reports that she has never smoked. She does not have any smokeless tobacco history on file. She reports that she does not drink alcohol or use illicit drugs.  Family History  Problem Relation Age of Onset  . Coronary artery disease    . Breast cancer Daughter   . Stroke Mother   . Heart attack Father     Medications: Patient's Medications  New Prescriptions   No medications on file  Previous Medications   BRINZOLAMIDE (AZOPT) 1 % OPHTHALMIC SUSPENSION    Place 1 drop into both eyes 2 (two) times daily.    DOCUSATE SODIUM (COLACE) 100 MG  CAPSULE    Take 100 mg by mouth 2 (two) times daily.     DULOXETINE (CYMBALTA) 60 MG CAPSULE    Take 60 mg by mouth daily. * Do not open*   HYDROCODONE-ACETAMINOPHEN (NORCO/VICODIN) 5-325 MG PER TABLET    Take 1 tablet by mouth 4 (four) times daily.   LATANOPROST (XALATAN) 0.005 % OPHTHALMIC SOLUTION    Place 1 drop into both eyes at bedtime.   LEVOTHYROXINE (SYNTHROID, LEVOTHROID) 75 MCG TABLET    Take 37.5 mcg by mouth daily before breakfast. Take 1/2 tablet by mouth at breakfast, check pulse weekly on Wednesday.   LOSARTAN (COZAAR) 100 MG TABLET    Take 100 mg by mouth daily.   METOPROLOL (LOPRESSOR) 50 MG TABLET    Take 1 tablet (50 mg total) by mouth 2 (two) times daily.   MULTIPLE VITAMINS-MINERALS (CERTAVITE/ANTIOXIDANTS PO)    Take 1 tablet by mouth daily. Certavite-anitoxidant 18 mg-0.4 mg    NUTRITIONAL SUPPLEMENTS (RESOURCE 2.0 PO)    Take 90 mLs by mouth 2 (two) times daily.  Modified Medications   No medications on file  Discontinued Medications   No medications on file     Physical Exam: Physical Exam  Constitutional: She appears well-developed and well-nourished. No distress.  HENT:  Head: Atraumatic.  Eyes: EOM are normal. Pupils are equal, round, and reactive to light.  Neck: Normal range of motion. Neck supple. No JVD present. No thyromegaly present.  Cardiovascular: Normal rate and regular rhythm.   Murmur heard.  Systolic murmur is present with a grade of 2/6  Pulmonary/Chest: Effort normal. She has no wheezes. She has rales in the left lower field.  Abdominal: Soft. Bowel sounds are normal. There is no tenderness.  Musculoskeletal: Normal range of motion. She exhibits no edema and no tenderness.  Lymphadenopathy:    She has no cervical adenopathy.  Neurological: She is alert. She has normal reflexes. No cranial nerve deficit. Coordination normal.  Skin: Skin is warm and dry. No rash noted. She is not diaphoretic.    Filed Vitals:   05/30/13 1226  BP: 149/72   Pulse: 60  Temp: 97.6 F (36.4 C)  TempSrc: Tympanic  Resp: 18      Labs reviewed: Basic Metabolic Panel:  Recent Labs  40/98/11 11/21/12 12/14/12 02/15/13  NA 139 138  --  137  K 5.0 4.3  --  4.3  BUN 22* 29*  --  15  CREATININE 0.9 0.9  --  0.8  TSH  --  2.18 4.28  --    CBC:  Recent Labs  09/22/12  WBC 8.9  HGB 12.3  HCT 37  PLT 324    Past Procedures: 10/17/12 CXR CHF and right effusion.     Assessment/Plan Depression Stabilized in SNF and Cymbalta 60mg . She stated that she doesn't sleep well at night but declined sleeping aid.               Anemia Update CBC  Diastolic CHF, acute on chronic Compensated on Furosemide 60mg  daily, BNP 141.7 10/24/12, bun/crea 15/0.83 02/27/13-update CMP              HYPERTENSION Controlled on Losartan 100mg  and Metoprolol 50mg  bid              Unspecified hypothyroidism Last TSH 4.613, takes Levothyroxine 37.107mcg, f/u TSH 2.18 11/21/12--4.284 12/14/12--update TSH              OSTEOARTHRITIS Takes Norco qid for pain control.         Family/ Staff Communication: none  Goals of Care: SNF  Labs/tests ordered: CBC, CMP, TSH

## 2013-05-30 NOTE — Assessment & Plan Note (Signed)
Update CBC. 

## 2013-05-30 NOTE — Assessment & Plan Note (Signed)
Controlled on Losartan 100mg and Metoprolol 50mg bid   

## 2013-05-31 LAB — TSH: TSH: 5.93 u[IU]/mL — AB (ref 0.41–5.90)

## 2013-05-31 LAB — HEPATIC FUNCTION PANEL
AST: 15 U/L (ref 13–35)
Bilirubin, Total: 0.4 mg/dL

## 2013-05-31 LAB — CBC AND DIFFERENTIAL
HCT: 37 % (ref 36–46)
Hemoglobin: 12 g/dL (ref 12.0–16.0)
Platelets: 407 10*3/uL — AB (ref 150–399)
WBC: 9.5 10^3/mL

## 2013-05-31 LAB — BASIC METABOLIC PANEL
BUN: 20 mg/dL (ref 4–21)
Glucose: 96 mg/dL
Sodium: 140 mmol/L (ref 137–147)

## 2013-07-02 ENCOUNTER — Encounter: Payer: Self-pay | Admitting: Nurse Practitioner

## 2013-07-02 ENCOUNTER — Non-Acute Institutional Stay (SKILLED_NURSING_FACILITY): Payer: Medicare Other | Admitting: Nurse Practitioner

## 2013-07-02 DIAGNOSIS — D649 Anemia, unspecified: Secondary | ICD-10-CM

## 2013-07-02 DIAGNOSIS — I5033 Acute on chronic diastolic (congestive) heart failure: Secondary | ICD-10-CM

## 2013-07-02 DIAGNOSIS — F329 Major depressive disorder, single episode, unspecified: Secondary | ICD-10-CM

## 2013-07-02 DIAGNOSIS — I10 Essential (primary) hypertension: Secondary | ICD-10-CM

## 2013-07-02 DIAGNOSIS — E039 Hypothyroidism, unspecified: Secondary | ICD-10-CM

## 2013-07-02 DIAGNOSIS — I509 Heart failure, unspecified: Secondary | ICD-10-CM

## 2013-07-02 DIAGNOSIS — K5900 Constipation, unspecified: Secondary | ICD-10-CM

## 2013-07-02 DIAGNOSIS — M1990 Unspecified osteoarthritis, unspecified site: Secondary | ICD-10-CM

## 2013-07-02 NOTE — Progress Notes (Signed)
Patient ID: [name], female   DOB: 1913/09/29, 79 y.o.   MRN: [id number]  Code Status: DNR  Allergies  Allergen Reactions  . Aspirin Nausea Only  . Atorvastatin Nausea Only  . Benazepril     Not known  . Clopidogrel Bisulfate   . Flexeril [Cyclobenzaprine Hcl]   . Guanfacine     Not known.  Marland Kitchen Hctz [Hydrochlorothiazide]     Not known  . Ramipril     REACTION: cough    Chief Complaint  Patient presents with  . Medical Managment of Chronic Issues  . Dementia    HPI: Patient is a 79 y.o. female seen in the SNF at [facility] today for evaluation of thyroid and  her chronic medical conditions.  Problem List Items Addressed This Visit   Anemia - Primary (Chronic)     Resolved. Hgb 12.0 05/31/13      Depression     Stabilized in SNF and Cymbalta 60mg . She stated that she doesn't sleep well at night but declined sleeping aid.                   Diastolic CHF, acute on chronic     Compensated on Furosemide 60mg  daily, BNP 141.7 10/24/12, bun/crea 20/0.77 05/31/13                  HTN (hypertension), malignant     Controlled on Losartan 100mg  and Metoprolol 50mg  bid                  OSTEOARTHRITIS     Takes Norco qid for pain control.           Unspecified constipation     Stable on Colace 100mg  bid.       Unspecified hypothyroidism     Last TSH 5.932 05/31/13, will increase Levothyroxine daily, update TSH in 12 weeks.                      Review of Systems:  Review of Systems  Constitutional: Positive for malaise/fatigue (chronic). Negative for fever, chills, weight loss and diaphoresis.  HENT: Positive for hearing loss (severe). Negative for congestion, ear pain and sore throat.   Eyes: Negative for pain and discharge.  Respiratory: Positive for shortness of breath. Negative for cough, sputum production and wheezing.   Cardiovascular: Positive for PND (chronic). Negative for chest  pain, palpitations, orthopnea, claudication and leg swelling.  Gastrointestinal: Negative for nausea, vomiting, abdominal pain, diarrhea and constipation.  Genitourinary: Positive for frequency (incontinent of bladder). Negative for dysuria, urgency and flank pain.  Musculoskeletal: Positive for back pain (chronic), falls and myalgias (chronic).  Skin: Negative for itching and rash.  Neurological: Positive for weakness (generalized). Negative for dizziness, tremors, speech change, seizures, loss of consciousness and headaches.  Endo/Heme/Allergies: Negative for environmental allergies and polydipsia. Does not bruise/bleed easily.  Psychiatric/Behavioral: Positive for depression (flat affect) and memory loss. Negative for hallucinations. The patient is not nervous/anxious and does not have insomnia.      Past Medical History  Diagnosis Date  . Osteoarthritis   . Dyslipidemia   . Carotid stenosis   . Hypertension   . CAD (coronary artery disease)   . Glaucoma   . Hearing loss   . Carcinoma     basal cell  . Cataract   . Anemia   . Macular degeneration   . Other abnormal glucose 2003  . Shortness of breath   .  Myocardial infarction   . Diastolic CHF, acute on chronic 06/24/2011   Past Surgical History  Procedure Laterality Date  . Cataract extraction    . Tonsillectomy    . Non-stemi  2001    w/ a successful PTCA  . Stent of rca      unsuccessful PTCA of left circumflex  . Adenoidectomy    . Tonsillectomy    . Eye surgery      cataracts B eyes   Social History:   reports that she has never smoked. She does not have any smokeless tobacco history on file. She reports that she does not drink alcohol or use illicit drugs.  Family History  Problem Relation Age of Onset  . Coronary artery disease    . Breast cancer Daughter   . Stroke Mother   . Heart attack Father     Medications: Patient's Medications  New Prescriptions   No medications on file  Previous Medications    BRINZOLAMIDE (AZOPT) 1 % OPHTHALMIC SUSPENSION    Place 1 drop into both eyes 2 (two) times daily.    DOCUSATE SODIUM (COLACE) 100 MG CAPSULE    Take 100 mg by mouth 2 (two) times daily.     DULOXETINE (CYMBALTA) 60 MG CAPSULE    Take 60 mg by mouth daily. * Do not open*   HYDROCODONE-ACETAMINOPHEN (NORCO/VICODIN) 5-325 MG PER TABLET    Take 1 tablet by mouth 4 (four) times daily.   LATANOPROST (XALATAN) 0.005 % OPHTHALMIC SOLUTION    Place 1 drop into both eyes at bedtime.   LEVOTHYROXINE (SYNTHROID, LEVOTHROID) 75 MCG TABLET    Take 37.5 mcg by mouth daily before breakfast. Take 1/2 tablet by mouth at breakfast, check pulse weekly on Wednesday.   LOSARTAN (COZAAR) 100 MG TABLET    Take 100 mg by mouth daily.   METOPROLOL (LOPRESSOR) 50 MG TABLET    Take 1 tablet (50 mg total) by mouth 2 (two) times daily.   MULTIPLE VITAMINS-MINERALS (CERTAVITE/ANTIOXIDANTS PO)    Take 1 tablet by mouth daily. Certavite-anitoxidant 18 mg-0.4 mg    NUTRITIONAL SUPPLEMENTS (RESOURCE 2.0 PO)    Take 90 mLs by mouth 2 (two) times daily.  Modified Medications   No medications on file  Discontinued Medications   No medications on file     Physical Exam: Physical Exam  Constitutional: She appears well-developed and well-nourished. No distress.  HENT:  Head: Atraumatic.  Eyes: EOM are normal. Pupils are equal, round, and reactive to light.  Neck: Normal range of motion. Neck supple. No JVD present. No thyromegaly present.  Cardiovascular: Normal rate and regular rhythm.   Murmur heard.  Systolic murmur is present with a grade of 2/6  Pulmonary/Chest: Effort normal. She has no wheezes. She has rales in the left lower field.  Abdominal: Soft. Bowel sounds are normal. There is no tenderness.  Musculoskeletal: Normal range of motion. She exhibits no edema and no tenderness.  Lymphadenopathy:    She has no cervical adenopathy.  Neurological: She is alert. She has normal reflexes. No cranial nerve deficit.  Coordination normal.  Skin: Skin is warm and dry. No rash noted. She is not diaphoretic.     Filed Vitals:   07/02/13 1232  BP: 132/66  Pulse: 73  Temp: 97.8 F (36.6 C)  TempSrc: Tympanic  Resp: 20      Labs reviewed: Basic Metabolic Panel:  Recent Labs  16/10/96 12/14/12 02/15/13 05/31/13  NA 138  --  137 140  K 4.3  --  4.3 3.8  BUN 29*  --  15 20  CREATININE 0.9  --  0.8 0.8  TSH 2.18 4.28  --  5.93*   CBC:  Recent Labs  09/22/12 05/31/13  WBC 8.9 9.5  HGB 12.3 12.0  HCT 37 37  PLT 324 407*    Past Procedures: 10/17/12 CXR CHF and right effusion.     Assessment/Plan Anemia Resolved. Hgb 12.0 05/31/13    Unspecified hypothyroidism Last TSH 5.932 05/31/13, will increase Levothyroxine daily, update TSH in 12 weeks.                 Depression Stabilized in SNF and Cymbalta 60mg . She stated that she doesn't sleep well at night but declined sleeping aid.                 Diastolic CHF, acute on chronic Compensated on Furosemide 60mg  daily, BNP 141.7 10/24/12, bun/crea 20/0.77 05/31/13                HTN (hypertension), malignant Controlled on Losartan 100mg  and Metoprolol 50mg  bid                Unspecified constipation Stable on Colace 100mg  bid.     OSTEOARTHRITIS Takes Norco qid for pain control.           Family/ Staff Communication: comfort measures.   Goals of Care: SNF  Labs/tests ordered: TSH in 12 weeks.

## 2013-07-02 NOTE — Assessment & Plan Note (Signed)
Stable on Colace 100mg bid   

## 2013-07-02 NOTE — Assessment & Plan Note (Signed)
Compensated on Furosemide 60mg  daily, BNP 141.7 10/24/12, bun/crea 20/0.77 05/31/13

## 2013-07-02 NOTE — Assessment & Plan Note (Signed)
Takes Norco qid for pain control.   

## 2013-07-02 NOTE — Assessment & Plan Note (Signed)
Controlled on Losartan 100mg and Metoprolol 50mg bid   

## 2013-07-02 NOTE — Assessment & Plan Note (Signed)
Resolved. Hgb 12.0 05/31/13

## 2013-07-02 NOTE — Assessment & Plan Note (Signed)
Last TSH 5.932 05/31/13, will increase Levothyroxine 50mcg daily, update TSH in 12 weeks.                  

## 2013-07-02 NOTE — Assessment & Plan Note (Signed)
Stabilized in SNF and Cymbalta 60mg. She stated that she doesn't sleep well at night but declined sleeping aid.    

## 2013-07-16 ENCOUNTER — Encounter: Payer: Self-pay | Admitting: Nurse Practitioner

## 2013-07-16 ENCOUNTER — Non-Acute Institutional Stay (SKILLED_NURSING_FACILITY): Payer: Medicare Other | Admitting: Nurse Practitioner

## 2013-07-16 DIAGNOSIS — I10 Essential (primary) hypertension: Secondary | ICD-10-CM

## 2013-07-16 DIAGNOSIS — E039 Hypothyroidism, unspecified: Secondary | ICD-10-CM

## 2013-07-16 DIAGNOSIS — D649 Anemia, unspecified: Secondary | ICD-10-CM

## 2013-07-16 DIAGNOSIS — I509 Heart failure, unspecified: Secondary | ICD-10-CM

## 2013-07-16 DIAGNOSIS — F32A Depression, unspecified: Secondary | ICD-10-CM

## 2013-07-16 DIAGNOSIS — F329 Major depressive disorder, single episode, unspecified: Secondary | ICD-10-CM

## 2013-07-16 DIAGNOSIS — K047 Periapical abscess without sinus: Secondary | ICD-10-CM | POA: Insufficient documentation

## 2013-07-16 DIAGNOSIS — K5900 Constipation, unspecified: Secondary | ICD-10-CM

## 2013-07-16 DIAGNOSIS — I5033 Acute on chronic diastolic (congestive) heart failure: Secondary | ICD-10-CM

## 2013-07-16 DIAGNOSIS — M1990 Unspecified osteoarthritis, unspecified site: Secondary | ICD-10-CM

## 2013-07-16 NOTE — Assessment & Plan Note (Signed)
Controlled on Losartan 100mg and Metoprolol 50mg bid   

## 2013-07-16 NOTE — Assessment & Plan Note (Signed)
The left middle lower gum purulent drainage and c/o pain for the past a few days--Amoxicillin 1gm po x1 then 500mg  q8hr for 3 days. Dental appointment

## 2013-07-16 NOTE — Assessment & Plan Note (Signed)
Resolved. Hgb 12.0 05/31/13   

## 2013-07-16 NOTE — Assessment & Plan Note (Signed)
Takes Norco qid for pain control.   

## 2013-07-16 NOTE — Assessment & Plan Note (Signed)
Last TSH 5.932 05/31/13, will increase Levothyroxine daily, update TSH in 12 weeks.

## 2013-07-16 NOTE — Progress Notes (Signed)
Patient ID: [name], female   DOB: Sep 13, 1913, 79 y.o.   MRN: [id number]  Code Status: DNR  Allergies  Allergen Reactions  . Aspirin Nausea Only  . Atorvastatin Nausea Only  . Benazepril     Not known  . Clopidogrel Bisulfate   . Flexeril [Cyclobenzaprine Hcl]   . Guanfacine     Not known.  Marland Kitchen Hctz [Hydrochlorothiazide]     Not known  . Ramipril     REACTION: cough    Chief Complaint  Patient presents with  . Medical Managment of Chronic Issues    dnetal abscess   . Acute Visit    HPI: Patient is a 79 y.o. female seen in the SNF at [facility] today for evaluation of left lower middle toothache/purulence drainage  and  her chronic medical conditions.  Problem List Items Addressed This Visit   Unspecified hypothyroidism     Last TSH 5.932 05/31/13, will increase Levothyroxine daily, update TSH in 12 weeks.                     Unspecified constipation     Stable on Colace 100mg  bid.         OSTEOARTHRITIS     Takes Norco qid for pain control.             HTN (hypertension), malignant     Controlled on Losartan 100mg  and Metoprolol 50mg  bid                    Relevant Medications      furosemide (LASIX) 20 MG tablet   Diastolic CHF, acute on chronic     Compensated on Furosemide 60mg  daily, BNP 141.7 10/24/12, bun/crea 20/0.77 05/31/13                    Depression     Stabilized in SNF and Cymbalta 60mg . She stated that she doesn't sleep well at night but declined sleeping aid.                     Dental abscess - Primary     The left middle lower gum purulent drainage and c/o pain for the past a few days--Amoxicillin 1gm po x1 then 500mg  q8hr for 3 days. Dental appointment     Anemia (Chronic)     Resolved. Hgb 12.0 05/31/13           Review of Systems:  Review of Systems  Constitutional: Positive for malaise/fatigue (chronic). Negative for fever, chills,  weight loss and diaphoresis.  HENT: Positive for hearing loss (severe). Negative for congestion, ear pain and sore throat.        The left lower middle gum small abscess and purulent drainage  Eyes: Negative for pain and discharge.  Respiratory: Positive for shortness of breath. Negative for cough, sputum production and wheezing.   Cardiovascular: Positive for PND (chronic). Negative for chest pain, palpitations, orthopnea, claudication and leg swelling.  Gastrointestinal: Negative for nausea, vomiting, abdominal pain, diarrhea and constipation.  Genitourinary: Positive for frequency (incontinent of bladder). Negative for dysuria, urgency and flank pain.  Musculoskeletal: Positive for back pain (chronic), falls and myalgias (chronic).  Skin: Negative for itching and rash.  Neurological: Positive for weakness (generalized). Negative for dizziness, tremors, speech change, seizures, loss of consciousness and headaches.  Endo/Heme/Allergies: Negative for environmental allergies and polydipsia. Does not bruise/bleed easily.  Psychiatric/Behavioral: Positive for depression (flat affect) and  memory loss. Negative for hallucinations. The patient is not nervous/anxious and does not have insomnia.      Past Medical History  Diagnosis Date  . Osteoarthritis   . Dyslipidemia   . Carotid stenosis   . Hypertension   . CAD (coronary artery disease)   . Glaucoma   . Hearing loss   . Carcinoma     basal cell  . Cataract   . Anemia   . Macular degeneration   . Other abnormal glucose 2003  . Shortness of breath   . Myocardial infarction   . Diastolic CHF, acute on chronic 06/24/2011   Past Surgical History  Procedure Laterality Date  . Cataract extraction    . Tonsillectomy    . Non-stemi  2001    w/ a successful PTCA  . Stent of rca      unsuccessful PTCA of left circumflex  . Adenoidectomy    . Tonsillectomy    . Eye surgery      cataracts B eyes   Social History:   reports that she  has never smoked. She does not have any smokeless tobacco history on file. She reports that she does not drink alcohol or use illicit drugs.  Family History  Problem Relation Age of Onset  . Coronary artery disease    . Breast cancer Daughter   . Stroke Mother   . Heart attack Father     Medications: Patient's Medications  New Prescriptions   No medications on file  Previous Medications   BRINZOLAMIDE (AZOPT) 1 % OPHTHALMIC SUSPENSION    Place 1 drop into both eyes 2 (two) times daily.    DOCUSATE SODIUM (COLACE) 100 MG CAPSULE    Take 100 mg by mouth 2 (two) times daily.     DULOXETINE (CYMBALTA) 60 MG CAPSULE    Take 60 mg by mouth daily. * Do not open*   FUROSEMIDE (LASIX) 20 MG TABLET    Take 60 mg by mouth.   HYDROCODONE-ACETAMINOPHEN (NORCO/VICODIN) 5-325 MG PER TABLET    Take 1 tablet by mouth 4 (four) times daily.   LATANOPROST (XALATAN) 0.005 % OPHTHALMIC SOLUTION    Place 1 drop into both eyes at bedtime.   LEVOTHYROXINE (SYNTHROID, LEVOTHROID) 75 MCG TABLET    Take 50 mcg by mouth daily before breakfast. Take 1/2 tablet by mouth at breakfast, check pulse weekly on Wednesday.   LOSARTAN (COZAAR) 100 MG TABLET    Take 100 mg by mouth daily.   METOPROLOL (LOPRESSOR) 50 MG TABLET    Take 1 tablet (50 mg total) by mouth 2 (two) times daily.   MULTIPLE VITAMINS-MINERALS (CERTAVITE/ANTIOXIDANTS PO)    Take 1 tablet by mouth daily. Certavite-anitoxidant 18 mg-0.4 mg    NUTRITIONAL SUPPLEMENTS (RESOURCE 2.0 PO)    Take 90 mLs by mouth 2 (two) times daily.  Modified Medications   No medications on file  Discontinued Medications   No medications on file     Physical Exam: Physical Exam  Constitutional: She appears well-developed and well-nourished. No distress.  HENT:  Head: Atraumatic.  The left lower middle gum small abscess and purulent drainage   Eyes: EOM are normal. Pupils are equal, round, and reactive to light.  Neck: Normal range of motion. Neck supple. No JVD  present. No thyromegaly present.  Cardiovascular: Normal rate and regular rhythm.   Murmur heard.  Systolic murmur is present with a grade of 2/6  Pulmonary/Chest: Effort normal. She has no wheezes. She has rales in  the left lower field.  Abdominal: Soft. Bowel sounds are normal. There is no tenderness.  Musculoskeletal: Normal range of motion. She exhibits no edema and no tenderness.  Lymphadenopathy:    She has no cervical adenopathy.  Neurological: She is alert. She has normal reflexes. No cranial nerve deficit. Coordination normal.  Skin: Skin is warm and dry. No rash noted. She is not diaphoretic.     Filed Vitals:   07/16/13 1146  BP: 136/60  Pulse: 62  Temp: 97.9 F (36.6 C)  TempSrc: Tympanic  Resp: 20      Labs reviewed: Basic Metabolic Panel:  Recent Labs  40/98/11 12/14/12 02/15/13 05/31/13  NA 138  --  137 140  K 4.3  --  4.3 3.8  BUN 29*  --  15 20  CREATININE 0.9  --  0.8 0.8  TSH 2.18 4.28  --  5.93*   CBC:  Recent Labs  09/22/12 05/31/13  WBC 8.9 9.5  HGB 12.3 12.0  HCT 37 37  PLT 324 407*    Past Procedures: 10/17/12 CXR CHF and right effusion.     Assessment/Plan Dental abscess The left middle lower gum purulent drainage and c/o pain for the past a few days--Amoxicillin 1gm po x1 then 500mg  q8hr for 3 days. Dental appointment   Anemia Resolved. Hgb 12.0 05/31/13      Depression Stabilized in SNF and Cymbalta 60mg . She stated that she doesn't sleep well at night but declined sleeping aid.                   Diastolic CHF, acute on chronic Compensated on Furosemide 60mg  daily, BNP 141.7 10/24/12, bun/crea 20/0.77 05/31/13                  HTN (hypertension), malignant Controlled on Losartan 100mg  and Metoprolol 50mg  bid                  Unspecified hypothyroidism Last TSH 5.932 05/31/13, will increase Levothyroxine daily, update TSH in 12 weeks.                    Unspecified constipation Stable on Colace 100mg  bid.       OSTEOARTHRITIS Takes Norco qid for pain control.             Family/ Staff Communication: comfort measures. Dental appointment as needed.   Goals of Care: SNF  Labs/tests ordered: TSH in 12 weeks.

## 2013-07-16 NOTE — Assessment & Plan Note (Signed)
Stabilized in SNF and Cymbalta 60mg. She stated that she doesn't sleep well at night but declined sleeping aid.    

## 2013-07-16 NOTE — Assessment & Plan Note (Signed)
Compensated on Furosemide 60mg daily, BNP 141.7 10/24/12, bun/crea 20/0.77 05/31/13               

## 2013-07-16 NOTE — Assessment & Plan Note (Signed)
Stable on Colace 100mg bid   

## 2013-08-20 ENCOUNTER — Non-Acute Institutional Stay (SKILLED_NURSING_FACILITY): Payer: Medicare Other | Admitting: Nurse Practitioner

## 2013-08-20 ENCOUNTER — Encounter: Payer: Self-pay | Admitting: Nurse Practitioner

## 2013-08-20 DIAGNOSIS — I5033 Acute on chronic diastolic (congestive) heart failure: Secondary | ICD-10-CM

## 2013-08-20 DIAGNOSIS — F3289 Other specified depressive episodes: Secondary | ICD-10-CM

## 2013-08-20 DIAGNOSIS — I2510 Atherosclerotic heart disease of native coronary artery without angina pectoris: Secondary | ICD-10-CM

## 2013-08-20 DIAGNOSIS — M1990 Unspecified osteoarthritis, unspecified site: Secondary | ICD-10-CM

## 2013-08-20 DIAGNOSIS — E039 Hypothyroidism, unspecified: Secondary | ICD-10-CM

## 2013-08-20 DIAGNOSIS — F329 Major depressive disorder, single episode, unspecified: Secondary | ICD-10-CM

## 2013-08-20 DIAGNOSIS — F32A Depression, unspecified: Secondary | ICD-10-CM

## 2013-08-20 DIAGNOSIS — D649 Anemia, unspecified: Secondary | ICD-10-CM

## 2013-08-20 DIAGNOSIS — I509 Heart failure, unspecified: Secondary | ICD-10-CM

## 2013-08-20 DIAGNOSIS — K5900 Constipation, unspecified: Secondary | ICD-10-CM

## 2013-08-20 DIAGNOSIS — I10 Essential (primary) hypertension: Secondary | ICD-10-CM

## 2013-08-20 NOTE — Assessment & Plan Note (Signed)
Controlled on Losartan 100mg and Metoprolol 50mg bid   

## 2013-08-20 NOTE — Assessment & Plan Note (Signed)
Compensated on Furosemide 60mg  daily, BNP 141.7 10/24/12, bun/crea 20/0.77 05/31/13, update BMP

## 2013-08-20 NOTE — Assessment & Plan Note (Signed)
Stabilized in SNF and Cymbalta 60mg. She stated that she doesn't sleep well at night but declined sleeping aid.    

## 2013-08-20 NOTE — Assessment & Plan Note (Signed)
Stable, no angina since last visit.

## 2013-08-20 NOTE — Assessment & Plan Note (Signed)
Last TSH 5.932 05/31/13, increased Levothyroxine 35mcg daily, update TSH in 12 weeks scheduled.

## 2013-08-20 NOTE — Assessment & Plan Note (Signed)
Resolved. Hgb 12.0 05/31/13, update CBC

## 2013-08-20 NOTE — Assessment & Plan Note (Signed)
Stable on Colace 100mg bid   

## 2013-08-20 NOTE — Progress Notes (Signed)
Patient ID: [name], female   DOB: [birth year], 80 y.o.   MRN: [id number]    Patient ID: [name], female   DOB: [birth year], 80 y.o.   MRN: [id number]  Code Status: DNR  Allergies  Allergen Reactions  . Aspirin Nausea Only  . Atorvastatin Nausea Only  . Benazepril     Not known  . Clopidogrel Bisulfate   . Flexeril [Cyclobenzaprine Hcl]   . Guanfacine     Not known.  Marland Kitchen Hctz [Hydrochlorothiazide]     Not known  . Ramipril     REACTION: cough    Chief Complaint  Patient presents with  . Medical Managment of Chronic Issues  . Dementia    HPI: Patient is a 80 y.o. female seen in the SNF at [facility] today for evaluation of  her chronic medical conditions.  Problem List Items Addressed This Visit   Anemia - Primary (Chronic)     Resolved. Hgb 12.0 05/31/13, update CBC          OSTEOARTHRITIS     Takes Norco qid for pain control.           CORONARY ARTERY DISEASE     Stable, no angina since last visit.     Diastolic CHF, acute on chronic     Compensated on Furosemide 60mg  daily, BNP 141.7 10/24/12, bun/crea 20/0.77 05/31/13, update BMP                      HTN (hypertension), malignant     Controlled on Losartan 100mg  and Metoprolol 50mg  bid                      Unspecified hypothyroidism     Last TSH 5.932 05/31/13, increased Levothyroxine 81mcg daily, update TSH in 12 weeks scheduled.                       Depression     Stabilized in SNF and Cymbalta 60mg . She stated that she doesn't sleep well at night but declined sleeping aid.                       Unspecified constipation     Stable on Colace 100mg  bid.              Review of Systems:  Review of Systems  Constitutional: Negative for fever, chills, weight loss, malaise/fatigue (chronic) and diaphoresis.  HENT: Positive for hearing loss (severe). Negative for congestion, ear pain and sore throat.       The left lower middle gum small abscess and purulent drainage-resolved  Eyes: Negative for pain and discharge.  Respiratory: Positive for shortness of breath. Negative for cough, sputum production and wheezing.   Cardiovascular: Positive for PND (chronic). Negative for chest pain, palpitations, orthopnea, claudication and leg swelling.  Gastrointestinal: Negative for nausea, vomiting, abdominal pain, diarrhea and constipation.  Genitourinary: Positive for frequency (incontinent of bladder). Negative for dysuria, urgency and flank pain.  Musculoskeletal: Positive for back pain (chronic) and myalgias (chronic). Negative for falls.  Skin: Negative for itching and rash.  Neurological: Positive for weakness (generalized). Negative for dizziness, tremors, speech change, seizures, loss of consciousness and headaches.  Endo/Heme/Allergies: Negative for environmental allergies and polydipsia. Does not bruise/bleed easily.  Psychiatric/Behavioral: Positive for depression (flat affect) and memory loss. Negative for hallucinations. The patient is not nervous/anxious and does not have insomnia.  Past Medical History  Diagnosis Date  . Osteoarthritis   . Dyslipidemia   . Carotid stenosis   . Hypertension   . CAD (coronary artery disease)   . Glaucoma   . Hearing loss   . Carcinoma     basal cell  . Cataract   . Anemia   . Macular degeneration   . Other abnormal glucose 2003  . Shortness of breath   . Myocardial infarction   . Diastolic CHF, acute on chronic 06/24/2011   Past Surgical History  Procedure Laterality Date  . Cataract extraction    . Tonsillectomy    . Non-stemi  2001    w/ a successful PTCA  . Stent of rca      unsuccessful PTCA of left circumflex  . Adenoidectomy    . Tonsillectomy    . Eye surgery      cataracts B eyes   Social History:   reports that she has never smoked. She does not have any smokeless tobacco history on file. She reports that she does not  drink alcohol or use illicit drugs.  Family History  Problem Relation Age of Onset  . Coronary artery disease    . Breast cancer Daughter   . Stroke Mother   . Heart attack Father     Medications: Patient's Medications  New Prescriptions   No medications on file  Previous Medications   BRINZOLAMIDE (AZOPT) 1 % OPHTHALMIC SUSPENSION    Place 1 drop into both eyes 2 (two) times daily.    DOCUSATE SODIUM (COLACE) 100 MG CAPSULE    Take 100 mg by mouth 2 (two) times daily.     DULOXETINE (CYMBALTA) 60 MG CAPSULE    Take 60 mg by mouth daily. * Do not open*   FUROSEMIDE (LASIX) 20 MG TABLET    Take 60 mg by mouth.   HYDROCODONE-ACETAMINOPHEN (NORCO/VICODIN) 5-325 MG PER TABLET    Take 1 tablet by mouth 4 (four) times daily.   LATANOPROST (XALATAN) 0.005 % OPHTHALMIC SOLUTION    Place 1 drop into both eyes at bedtime.   LEVOTHYROXINE (SYNTHROID, LEVOTHROID) 75 MCG TABLET    Take 50 mcg by mouth daily before breakfast. Take 1/2 tablet by mouth at breakfast, check pulse weekly on Wednesday.   LOSARTAN (COZAAR) 100 MG TABLET    Take 100 mg by mouth daily.   METOPROLOL (LOPRESSOR) 50 MG TABLET    Take 1 tablet (50 mg total) by mouth 2 (two) times daily.   MULTIPLE VITAMINS-MINERALS (CERTAVITE/ANTIOXIDANTS PO)    Take 1 tablet by mouth daily. Certavite-anitoxidant 18 mg-0.4 mg    NUTRITIONAL SUPPLEMENTS (RESOURCE 2.0 PO)    Take 90 mLs by mouth 2 (two) times daily.  Modified Medications   No medications on file  Discontinued Medications   No medications on file     Physical Exam: Physical Exam  Constitutional: She appears well-developed and well-nourished. No distress.  HENT:  Head: Atraumatic.  The left lower middle gum small abscess and purulent drainage-resolved   Eyes: EOM are normal. Pupils are equal, round, and reactive to light.  Neck: Normal range of motion. Neck supple. No JVD present. No thyromegaly present.  Cardiovascular: Normal rate and regular rhythm.   Murmur heard.   Systolic murmur is present with a grade of 2/6  Pulmonary/Chest: Effort normal. She has no wheezes. She has rales in the left lower field.  Abdominal: Soft. Bowel sounds are normal. There is no tenderness.  Musculoskeletal: Normal range of  motion. She exhibits no edema and no tenderness.  Lymphadenopathy:    She has no cervical adenopathy.  Neurological: She is alert. She has normal reflexes. No cranial nerve deficit. Coordination normal.  Skin: Skin is warm and dry. No rash noted. She is not diaphoretic.     Filed Vitals:   08/20/13 1235  BP: 150/65  Pulse: 55  Temp: 98.6 F (37 C)  TempSrc: Tympanic  Resp: 22      Labs reviewed: Basic Metabolic Panel:  Recent Labs  11/21/12 12/14/12 02/15/13 05/31/13  NA 138  --  137 140  K 4.3  --  4.3 3.8  BUN 29*  --  15 20  CREATININE 0.9  --  0.8 0.8  TSH 2.18 4.28  --  5.93*   CBC:  Recent Labs  09/22/12 05/31/13  WBC 8.9 9.5  HGB 12.3 12.0  HCT 37 37  PLT 324 407*    Past Procedures: 10/17/12 CXR CHF and right effusion.     Assessment/Plan Anemia Resolved. Hgb 12.0 05/31/13, update CBC        Diastolic CHF, acute on chronic Compensated on Furosemide 60mg  daily, BNP 141.7 10/24/12, bun/crea 20/0.77 05/31/13, update BMP                    HTN (hypertension), malignant Controlled on Losartan 100mg  and Metoprolol 50mg  bid                    Depression Stabilized in SNF and Cymbalta 60mg . She stated that she doesn't sleep well at night but declined sleeping aid.                     Unspecified hypothyroidism Last TSH 5.932 05/31/13, increased Levothyroxine 31mcg daily, update TSH in 12 weeks scheduled.                     OSTEOARTHRITIS Takes Norco qid for pain control.         CORONARY ARTERY DISEASE Stable, no angina since last visit.   Unspecified constipation Stable on Colace 100mg  bid.           Family/ Staff  Communication: comfort measures. Dental appointment as needed.   Goals of Care: SNF  Labs/tests ordered: CBC and BMP

## 2013-08-20 NOTE — Assessment & Plan Note (Signed)
Takes Norco qid for pain control.   

## 2013-08-21 LAB — CBC AND DIFFERENTIAL
HCT: 34 % — AB (ref 36–46)
HEMOGLOBIN: 11.2 g/dL — AB (ref 12.0–16.0)
Platelets: 368 10*3/uL (ref 150–399)
WBC: 9.9 10*3/mL

## 2013-08-21 LAB — BASIC METABOLIC PANEL
BUN: 18 mg/dL (ref 4–21)
Creatinine: 0.8 mg/dL (ref 0.5–1.1)
GLUCOSE: 97 mg/dL
Potassium: 4.6 mmol/L (ref 3.4–5.3)
Sodium: 141 mmol/L (ref 137–147)

## 2013-09-25 LAB — TSH: TSH: 3.32 u[IU]/mL (ref <=5.90)

## 2013-10-04 ENCOUNTER — Other Ambulatory Visit: Payer: Self-pay | Admitting: *Deleted

## 2013-10-04 MED ORDER — HYDROCODONE-ACETAMINOPHEN 5-325 MG PO TABS: ORAL_TABLET | ORAL | Status: DC

## 2013-10-04 NOTE — Telephone Encounter (Signed)
[facility] 

## 2013-10-22 ENCOUNTER — Non-Acute Institutional Stay (SKILLED_NURSING_FACILITY): Payer: Medicare Other | Admitting: Nurse Practitioner

## 2013-10-22 ENCOUNTER — Encounter: Payer: Self-pay | Admitting: Nurse Practitioner

## 2013-10-22 DIAGNOSIS — I5033 Acute on chronic diastolic (congestive) heart failure: Secondary | ICD-10-CM

## 2013-10-22 DIAGNOSIS — E871 Hypo-osmolality and hyponatremia: Secondary | ICD-10-CM

## 2013-10-22 DIAGNOSIS — M1990 Unspecified osteoarthritis, unspecified site: Secondary | ICD-10-CM

## 2013-10-22 DIAGNOSIS — D649 Anemia, unspecified: Secondary | ICD-10-CM

## 2013-10-22 DIAGNOSIS — F32A Depression, unspecified: Secondary | ICD-10-CM

## 2013-10-22 DIAGNOSIS — I10 Essential (primary) hypertension: Secondary | ICD-10-CM

## 2013-10-22 DIAGNOSIS — F329 Major depressive disorder, single episode, unspecified: Secondary | ICD-10-CM

## 2013-10-22 DIAGNOSIS — I509 Heart failure, unspecified: Secondary | ICD-10-CM

## 2013-10-22 DIAGNOSIS — E039 Hypothyroidism, unspecified: Secondary | ICD-10-CM

## 2013-10-22 DIAGNOSIS — F3289 Other specified depressive episodes: Secondary | ICD-10-CM

## 2013-10-22 NOTE — Assessment & Plan Note (Signed)
Takes Norco qid for pain control.

## 2013-10-22 NOTE — Assessment & Plan Note (Signed)
Compensated on Furosemide 60mg  daily, BNP 141.7 10/24/12, bun/crea 20/0.77 05/31/13 and 18/0.75 08/21/13

## 2013-10-22 NOTE — Assessment & Plan Note (Signed)
Controlled on Losartan 100mg and Metoprolol 50mg bid   

## 2013-10-22 NOTE — Assessment & Plan Note (Signed)
Mild, chronic, Hgb 11-12. Hgb 12.0 05/31/13. Hgb 11.2 08/21/13

## 2013-10-22 NOTE — Progress Notes (Signed)
Patient ID: [name], female   DOB: 01-22-1914, 80 y.o.   MRN: [id number]    Patient ID: [name], female   DOB: 11-15-1913, 80 y.o.   MRN: [id number]  Code Status: DNR  Allergies  Allergen Reactions  . Aspirin Nausea Only  . Atorvastatin Nausea Only  . Benazepril     Not known  . Clopidogrel Bisulfate   . Flexeril [Cyclobenzaprine Hcl]   . Guanfacine     Not known.  Marland Kitchen Hctz [Hydrochlorothiazide]     Not known  . Ramipril     REACTION: cough    Chief Complaint  Patient presents with  . Medical Managment of Chronic Issues    HPI: Patient is a 80 y.o. female seen in the SNF at [facility] today for evaluation of  her chronic medical conditions.  Problem List Items Addressed This Visit   Unspecified hypothyroidism - Primary     Last TSH 5.932 05/31/13, increased Levothyroxine 5mcg daily, updated TSH 3.332 09/25/13      OSTEOARTHRITIS     Takes Norco qid for pain control.      Hyponatremia (Chronic)     Resolved Na 141 08/21/13    HYPERTENSION     Controlled on Losartan 100mg  and Metoprolol 50mg  bid     Diastolic CHF, acute on chronic     Compensated on Furosemide 60mg  daily, BNP 141.7 10/24/12, bun/crea 20/0.77 05/31/13 and 18/0.75 08/21/13      Depression     Stabilized in SNF and Cymbalta 60mg . She stated that she doesn't sleep well at night but declined sleeping aid.       Anemia (Chronic)     Mild, chronic, Hgb 11-12. Hgb 12.0 05/31/13. Hgb 11.2 08/21/13         Review of Systems:  Review of Systems  Constitutional: Negative for fever, chills, weight loss, malaise/fatigue (chronic) and diaphoresis.  HENT: Positive for hearing loss (severe). Negative for congestion, ear pain and sore throat.        The left lower middle gum small abscess and purulent drainage-resolved  Eyes: Negative for pain and discharge.  Respiratory: Positive for shortness of breath. Negative for cough, sputum production and wheezing.   Cardiovascular: Positive for  PND (chronic). Negative for chest pain, palpitations, orthopnea, claudication and leg swelling.  Gastrointestinal: Negative for nausea, vomiting, abdominal pain, diarrhea and constipation.  Genitourinary: Positive for frequency (incontinent of bladder). Negative for dysuria, urgency and flank pain.  Musculoskeletal: Positive for back pain (chronic) and myalgias (chronic). Negative for falls.  Skin: Negative for itching and rash.  Neurological: Positive for weakness (generalized). Negative for dizziness, tremors, speech change, seizures, loss of consciousness and headaches.  Endo/Heme/Allergies: Negative for environmental allergies and polydipsia. Does not bruise/bleed easily.  Psychiatric/Behavioral: Positive for depression (flat affect) and memory loss. Negative for hallucinations. The patient is not nervous/anxious and does not have insomnia.      Past Medical History  Diagnosis Date  . Osteoarthritis   . Dyslipidemia   . Carotid stenosis   . Hypertension   . CAD (coronary artery disease)   . Glaucoma   . Hearing loss   . Carcinoma     basal cell  . Cataract   . Anemia   . Macular degeneration   . Other abnormal glucose 2003  . Shortness of breath   . Myocardial infarction   . Diastolic CHF, acute on chronic 06/24/2011   Past Surgical History  Procedure Laterality Date  . Cataract extraction    .  Tonsillectomy    . Non-stemi  2001    w/ a successful PTCA  . Stent of rca      unsuccessful PTCA of left circumflex  . Adenoidectomy    . Tonsillectomy    . Eye surgery      cataracts B eyes   Social History:   reports that she has never smoked. She does not have any smokeless tobacco history on file. She reports that she does not drink alcohol or use illicit drugs.  Family History  Problem Relation Age of Onset  . Coronary artery disease    . Breast cancer Daughter   . Stroke Mother   . Heart attack Father     Medications: Patient's Medications  New Prescriptions    No medications on file  Previous Medications   BRINZOLAMIDE (AZOPT) 1 % OPHTHALMIC SUSPENSION    Place 1 drop into both eyes 2 (two) times daily.    DOCUSATE SODIUM (COLACE) 100 MG CAPSULE    Take 100 mg by mouth 2 (two) times daily.     DULOXETINE (CYMBALTA) 60 MG CAPSULE    Take 60 mg by mouth daily. * Do not open*   FUROSEMIDE (LASIX) 20 MG TABLET    Take 60 mg by mouth.   HYDROCODONE-ACETAMINOPHEN (NORCO/VICODIN) 5-325 MG PER TABLET    Take one tablet by mouth four times daily for pain   LATANOPROST (XALATAN) 0.005 % OPHTHALMIC SOLUTION    Place 1 drop into both eyes at bedtime.   LEVOTHYROXINE (SYNTHROID, LEVOTHROID) 75 MCG TABLET    Take 50 mcg by mouth daily before breakfast. Take 1/2 tablet by mouth at breakfast, check pulse weekly on Wednesday.   LOSARTAN (COZAAR) 100 MG TABLET    Take 100 mg by mouth daily.   METOPROLOL (LOPRESSOR) 50 MG TABLET    Take 1 tablet (50 mg total) by mouth 2 (two) times daily.   MULTIPLE VITAMINS-MINERALS (CERTAVITE/ANTIOXIDANTS PO)    Take 1 tablet by mouth daily. Certavite-anitoxidant 18 mg-0.4 mg    NUTRITIONAL SUPPLEMENTS (RESOURCE 2.0 PO)    Take 90 mLs by mouth 2 (two) times daily.  Modified Medications   No medications on file  Discontinued Medications   No medications on file     Physical Exam: Physical Exam  Constitutional: She appears well-developed and well-nourished. No distress.  HENT:  Head: Atraumatic.  The left lower middle gum small abscess and purulent drainage-resolved   Eyes: EOM are normal. Pupils are equal, round, and reactive to light.  Neck: Normal range of motion. Neck supple. No JVD present. No thyromegaly present.  Cardiovascular: Normal rate and regular rhythm.   Murmur heard.  Systolic murmur is present with a grade of 2/6  Pulmonary/Chest: Effort normal. She has no wheezes. She has rales in the left lower field.  Abdominal: Soft. Bowel sounds are normal. There is no tenderness.  Musculoskeletal: Normal range of  motion. She exhibits no edema and no tenderness.  Lymphadenopathy:    She has no cervical adenopathy.  Neurological: She is alert. She has normal reflexes. No cranial nerve deficit. Coordination normal.  Skin: Skin is warm and dry. No rash noted. She is not diaphoretic.     Filed Vitals:   10/22/13 1143  BP: 118/72  Pulse: 81  Temp: 96.4 F (35.8 C)  Resp: 18      Labs reviewed: Basic Metabolic Panel:  Recent Labs  12/14/12 02/15/13 05/31/13 08/21/13 09/25/13  NA  --  137 140 141  --  K  --  4.3 3.8 4.6  --   BUN  --  15 20 18   --   CREATININE  --  0.8 0.8 0.8  --   TSH 4.28  --  5.93*  --  3.32   CBC:  Recent Labs  05/31/13 08/21/13  WBC 9.5 9.9  HGB 12.0 11.2*  HCT 37 34*  PLT 407* 368    Past Procedures:  10/17/12 CXR CHF and right effusion.   Assessment/Plan Unspecified hypothyroidism Last TSH 5.932 05/31/13, increased Levothyroxine 83mcg daily, updated TSH 3.332 09/25/13    Hyponatremia Resolved Na 141 08/21/13  HYPERTENSION Controlled on Losartan 100mg  and Metoprolol 50mg  bid   Diastolic CHF, acute on chronic Compensated on Furosemide 60mg  daily, BNP 141.7 10/24/12, bun/crea 20/0.77 05/31/13 and 18/0.75 08/21/13    Depression Stabilized in SNF and Cymbalta 60mg . She stated that she doesn't sleep well at night but declined sleeping aid.     Anemia Mild, chronic, Hgb 11-12. Hgb 12.0 05/31/13. Hgb 11.2 08/21/13    OSTEOARTHRITIS Takes Norco qid for pain control.      Family/ Staff Communication: comfort measures. Dental appointment as needed.   Goals of Care: SNF  Labs/tests ordered: none

## 2013-10-22 NOTE — Assessment & Plan Note (Signed)
Last TSH 5.932 05/31/13, increased Levothyroxine 77mcg daily, updated TSH 3.332 09/25/13

## 2013-10-22 NOTE — Assessment & Plan Note (Signed)
Stabilized in SNF and Cymbalta 60mg . She stated that she doesn't sleep well at night but declined sleeping aid.

## 2013-10-22 NOTE — Assessment & Plan Note (Signed)
Resolved Na 141 08/21/13

## 2013-11-01 ENCOUNTER — Other Ambulatory Visit: Payer: Self-pay | Admitting: *Deleted

## 2013-11-01 MED ORDER — HYDROCODONE-ACETAMINOPHEN 5-325 MG PO TABS: ORAL_TABLET | ORAL | Status: DC

## 2013-11-01 NOTE — Telephone Encounter (Signed)
[facility] 

## 2013-11-21 ENCOUNTER — Non-Acute Institutional Stay (SKILLED_NURSING_FACILITY): Payer: Medicare Other | Admitting: Nurse Practitioner

## 2013-11-21 ENCOUNTER — Encounter: Payer: Self-pay | Admitting: Nurse Practitioner

## 2013-11-21 DIAGNOSIS — D649 Anemia, unspecified: Secondary | ICD-10-CM

## 2013-11-21 DIAGNOSIS — F32A Depression, unspecified: Secondary | ICD-10-CM

## 2013-11-21 DIAGNOSIS — I5033 Acute on chronic diastolic (congestive) heart failure: Secondary | ICD-10-CM

## 2013-11-21 DIAGNOSIS — F3289 Other specified depressive episodes: Secondary | ICD-10-CM

## 2013-11-21 DIAGNOSIS — E039 Hypothyroidism, unspecified: Secondary | ICD-10-CM

## 2013-11-21 DIAGNOSIS — I10 Essential (primary) hypertension: Secondary | ICD-10-CM

## 2013-11-21 DIAGNOSIS — M1990 Unspecified osteoarthritis, unspecified site: Secondary | ICD-10-CM

## 2013-11-21 DIAGNOSIS — F329 Major depressive disorder, single episode, unspecified: Secondary | ICD-10-CM

## 2013-11-21 DIAGNOSIS — I509 Heart failure, unspecified: Secondary | ICD-10-CM

## 2013-11-21 NOTE — Assessment & Plan Note (Signed)
compensated on Furosemide 60mg  daily, BNP 141.7 10/24/12, bun/crea 20/0.77 05/31/13 and 18/0.75 08/21/13

## 2013-11-21 NOTE — Assessment & Plan Note (Signed)
Last TSH 5.932 05/31/13, increased Levothyroxine 74mcg daily, updated TSH 3.332 09/25/13

## 2013-11-21 NOTE — Assessment & Plan Note (Addendum)
takes Norco qid for pain control.    

## 2013-11-21 NOTE — Assessment & Plan Note (Signed)
Stable, Hgb 11.2 08/21/13

## 2013-11-21 NOTE — Progress Notes (Signed)
Patient ID: [name], female   DOB: 01/24/1914, 80 y.o.   MRN: [id number]    Patient ID: [name], female   DOB: [birth year], 80 y.o.   MRN: [id number]  Code Status: DNR  Allergies  Allergen Reactions  . Aspirin Nausea Only  . Atorvastatin Nausea Only  . Benazepril     Not known  . Clopidogrel Bisulfate   . Flexeril [Cyclobenzaprine Hcl]   . Guanfacine     Not known.  Marland Kitchen Hctz [Hydrochlorothiazide]     Not known  . Ramipril     REACTION: cough    Chief Complaint  Patient presents with  . Medical Managment of Chronic Issues    HPI: Patient is a 80 y.o. female seen in the SNF at [facility] today for evaluation of  her chronic medical conditions.  Problem List Items Addressed This Visit   Anemia (Chronic)     Stable, Hgb 11.2 08/21/13    Depression - Primary     Stabilized in SNF and Cymbalta 60mg . She stated that she doesn't sleep well at night but declined sleeping aid.      Diastolic CHF, acute on chronic     compensated on Furosemide 60mg  daily, BNP 141.7 10/24/12, bun/crea 20/0.77 05/31/13 and 18/0.75 08/21/13       HYPERTENSION     Controlled on Losartan 100mg  and Metoprolol 50mg  bid      OSTEOARTHRITIS     takes Norco qid for pain control.       Unspecified hypothyroidism     Last TSH 5.932 05/31/13, increased Levothyroxine 29mcg daily, updated TSH 3.332 09/25/13         Review of Systems:  Review of Systems  Constitutional: Negative for fever, chills, weight loss, malaise/fatigue (chronic) and diaphoresis.  HENT: Positive for hearing loss (severe). Negative for congestion, ear pain and sore throat.   Eyes: Negative for pain and discharge.  Respiratory: Positive for shortness of breath. Negative for cough, sputum production and wheezing.   Cardiovascular: Positive for PND (chronic). Negative for chest pain, palpitations, orthopnea, claudication and leg swelling.  Gastrointestinal: Negative for nausea, vomiting, abdominal pain, diarrhea  and constipation.  Genitourinary: Positive for frequency (incontinent of bladder). Negative for dysuria, urgency and flank pain.  Musculoskeletal: Positive for back pain (chronic) and myalgias (chronic). Negative for falls.  Skin: Negative for itching and rash.  Neurological: Positive for weakness (generalized). Negative for dizziness, tremors, speech change, seizures, loss of consciousness and headaches.  Endo/Heme/Allergies: Negative for environmental allergies and polydipsia. Does not bruise/bleed easily.  Psychiatric/Behavioral: Positive for depression (flat affect) and memory loss. Negative for hallucinations. The patient is not nervous/anxious and does not have insomnia.      Past Medical History  Diagnosis Date  . Osteoarthritis   . Dyslipidemia   . Carotid stenosis   . Hypertension   . CAD (coronary artery disease)   . Glaucoma   . Hearing loss   . Carcinoma     basal cell  . Cataract   . Anemia   . Macular degeneration   . Other abnormal glucose 2003  . Shortness of breath   . Myocardial infarction   . Diastolic CHF, acute on chronic 06/24/2011   Past Surgical History  Procedure Laterality Date  . Cataract extraction    . Tonsillectomy    . Non-stemi  2001    w/ a successful PTCA  . Stent of rca      unsuccessful PTCA of left circumflex  .  Adenoidectomy    . Tonsillectomy    . Eye surgery      cataracts B eyes   Social History:   reports that she has never smoked. She does not have any smokeless tobacco history on file. She reports that she does not drink alcohol or use illicit drugs.  Family History  Problem Relation Age of Onset  . Coronary artery disease    . Breast cancer Daughter   . Stroke Mother   . Heart attack Father     Medications: Patient's Medications  New Prescriptions   No medications on file  Previous Medications   BRINZOLAMIDE (AZOPT) 1 % OPHTHALMIC SUSPENSION    Place 1 drop into both eyes 2 (two) times daily.    DOCUSATE SODIUM  (COLACE) 100 MG CAPSULE    Take 100 mg by mouth 2 (two) times daily.     DULOXETINE (CYMBALTA) 60 MG CAPSULE    Take 60 mg by mouth daily. * Do not open*   FUROSEMIDE (LASIX) 20 MG TABLET    Take 60 mg by mouth.   HYDROCODONE-ACETAMINOPHEN (NORCO/VICODIN) 5-325 MG PER TABLET    Take one tablet by mouth four times daily for pain   LATANOPROST (XALATAN) 0.005 % OPHTHALMIC SOLUTION    Place 1 drop into both eyes at bedtime.   LEVOTHYROXINE (SYNTHROID, LEVOTHROID) 75 MCG TABLET    Take 50 mcg by mouth daily before breakfast. Take 1/2 tablet by mouth at breakfast, check pulse weekly on Wednesday.   LOSARTAN (COZAAR) 100 MG TABLET    Take 100 mg by mouth daily.   METOPROLOL (LOPRESSOR) 50 MG TABLET    Take 1 tablet (50 mg total) by mouth 2 (two) times daily.   MULTIPLE VITAMINS-MINERALS (CERTAVITE/ANTIOXIDANTS PO)    Take 1 tablet by mouth daily. Certavite-anitoxidant 18 mg-0.4 mg    NUTRITIONAL SUPPLEMENTS (RESOURCE 2.0 PO)    Take 90 mLs by mouth 2 (two) times daily.  Modified Medications   No medications on file  Discontinued Medications   No medications on file     Physical Exam: Physical Exam  Constitutional: She appears well-developed and well-nourished. No distress.  HENT:  Head: Atraumatic.     Eyes: EOM are normal. Pupils are equal, round, and reactive to light.  Neck: Normal range of motion. Neck supple. No JVD present. No thyromegaly present.  Cardiovascular: Normal rate and regular rhythm.   Murmur heard.  Systolic murmur is present with a grade of 2/6  Pulmonary/Chest: Effort normal. She has no wheezes. She has rales in the left lower field.  Abdominal: Soft. Bowel sounds are normal. There is no tenderness.  Musculoskeletal: Normal range of motion. She exhibits no edema and no tenderness.  Lymphadenopathy:    She has no cervical adenopathy.  Neurological: She is alert. She has normal reflexes. No cranial nerve deficit. Coordination normal.  Skin: Skin is warm and dry. No  rash noted. She is not diaphoretic.     Filed Vitals:   11/21/13 1310  BP: 140/84  Pulse: 72  Temp: 97.1 F (36.2 C)  TempSrc: Tympanic  Resp: 18   Labs reviewed: Basic Metabolic Panel:  Recent Labs  12/14/12 02/15/13 05/31/13 08/21/13 09/25/13  NA  --  137 140 141  --   K  --  4.3 3.8 4.6  --   BUN  --  15 20 18   --   CREATININE  --  0.8 0.8 0.8  --   TSH 4.28  --  5.93*  --  3.32   CBC:  Recent Labs  05/31/13 08/21/13  WBC 9.5 9.9  HGB 12.0 11.2*  HCT 37 34*  PLT 407* 368    Past Procedures:  10/17/12 CXR CHF and right effusion.   Assessment/Plan Depression Stabilized in SNF and Cymbalta 60mg . She stated that she doesn't sleep well at night but declined sleeping aid.    Diastolic CHF, acute on chronic compensated on Furosemide 60mg  daily, BNP 141.7 10/24/12, bun/crea 20/0.77 05/31/13 and 18/0.75 08/21/13     Unspecified hypothyroidism Last TSH 5.932 05/31/13, increased Levothyroxine 28mcg daily, updated TSH 3.332 09/25/13    HYPERTENSION Controlled on Losartan 100mg  and Metoprolol 50mg  bid    OSTEOARTHRITIS takes Norco qid for pain control.     Anemia Stable, Hgb 11.2 08/21/13    Family/ Staff Communication: comfort measures.   Goals of Care: SNF  Labs/tests ordered: none

## 2013-11-21 NOTE — Assessment & Plan Note (Signed)
Stabilized in SNF and Cymbalta 60mg . She stated that she doesn't sleep well at night but declined sleeping aid.

## 2013-11-21 NOTE — Assessment & Plan Note (Signed)
Controlled on Losartan 100mg and Metoprolol 50mg bid   

## 2013-11-30 ENCOUNTER — Other Ambulatory Visit: Payer: Self-pay | Admitting: *Deleted

## 2013-11-30 MED ORDER — HYDROCODONE-ACETAMINOPHEN 5-325 MG PO TABS: ORAL_TABLET | ORAL | Status: DC

## 2013-11-30 NOTE — Telephone Encounter (Signed)
[facility] 

## 2013-12-17 ENCOUNTER — Non-Acute Institutional Stay (SKILLED_NURSING_FACILITY): Payer: Medicare Other | Admitting: Nurse Practitioner

## 2013-12-17 ENCOUNTER — Encounter: Payer: Self-pay | Admitting: Nurse Practitioner

## 2013-12-17 DIAGNOSIS — K5900 Constipation, unspecified: Secondary | ICD-10-CM

## 2013-12-17 DIAGNOSIS — F3289 Other specified depressive episodes: Secondary | ICD-10-CM

## 2013-12-17 DIAGNOSIS — F329 Major depressive disorder, single episode, unspecified: Secondary | ICD-10-CM

## 2013-12-17 DIAGNOSIS — M1990 Unspecified osteoarthritis, unspecified site: Secondary | ICD-10-CM

## 2013-12-17 DIAGNOSIS — I10 Essential (primary) hypertension: Secondary | ICD-10-CM

## 2013-12-17 DIAGNOSIS — E039 Hypothyroidism, unspecified: Secondary | ICD-10-CM

## 2013-12-17 DIAGNOSIS — I509 Heart failure, unspecified: Secondary | ICD-10-CM

## 2013-12-17 DIAGNOSIS — F32A Depression, unspecified: Secondary | ICD-10-CM

## 2013-12-17 DIAGNOSIS — I5033 Acute on chronic diastolic (congestive) heart failure: Secondary | ICD-10-CM

## 2013-12-17 NOTE — Progress Notes (Signed)
Patient ID: [name], female   DOB: July 01, 1914, 80 y.o.   MRN: [id number]    Patient ID: [name], female   DOB: 1913-09-09, 80 y.o.   MRN: [id number]  Code Status: DNR  Allergies  Allergen Reactions  . Aspirin Nausea Only  . Atorvastatin Nausea Only  . Benazepril     Not known  . Clopidogrel Bisulfate   . Flexeril [Cyclobenzaprine Hcl]   . Guanfacine     Not known.  Marland Kitchen Hctz [Hydrochlorothiazide]     Not known  . Ramipril     REACTION: cough    Chief Complaint  Patient presents with  . Medical Management of Chronic Issues    HPI: Patient is a 80 y.o. female seen in the SNF at [facility] today for evaluation of  her chronic medical conditions.  Problem List Items Addressed This Visit   HYPERTENSION     Controlled on Losartan 100mg  and Metoprolol 50mg  bid      OSTEOARTHRITIS     akes Norco qid for pain control.       Diastolic CHF, acute on chronic     ompensated on Furosemide 60mg  daily, BNP 141.7 10/24/12, bun/crea 20/0.77 05/31/13 and 18/0.75 08/21/13      Unspecified hypothyroidism - Primary     Last TSH 5.932 05/31/13, increased Levothyroxine 17mcg daily, updated TSH 3.332 09/25/13      Depression     Stabilized in SNF and Cymbalta 60mg . She stated that she doesn't sleep well at night but declined sleeping aid.       Unspecified constipation     Stable on Colace 100mg  bid          Review of Systems:  Review of Systems  Constitutional: Negative for fever, chills, weight loss, malaise/fatigue (chronic) and diaphoresis.  HENT: Positive for hearing loss (severe). Negative for congestion, ear pain and sore throat.   Eyes: Negative for pain and discharge.  Respiratory: Positive for shortness of breath. Negative for cough, sputum production and wheezing.   Cardiovascular: Positive for PND (chronic). Negative for chest pain, palpitations, orthopnea, claudication and leg swelling.  Gastrointestinal: Negative for nausea, vomiting, abdominal  pain, diarrhea and constipation.  Genitourinary: Positive for frequency (incontinent of bladder). Negative for dysuria, urgency and flank pain.  Musculoskeletal: Positive for back pain (chronic) and myalgias (chronic). Negative for falls.  Skin: Negative for itching and rash.  Neurological: Positive for weakness (generalized). Negative for dizziness, tremors, speech change, seizures, loss of consciousness and headaches.  Endo/Heme/Allergies: Negative for environmental allergies and polydipsia. Does not bruise/bleed easily.  Psychiatric/Behavioral: Positive for depression (flat affect) and memory loss. Negative for hallucinations. The patient is not nervous/anxious and does not have insomnia.      Past Medical History  Diagnosis Date  . Osteoarthritis   . Dyslipidemia   . Carotid stenosis   . Hypertension   . CAD (coronary artery disease)   . Glaucoma   . Hearing loss   . Carcinoma     basal cell  . Cataract   . Anemia   . Macular degeneration   . Other abnormal glucose 2003  . Shortness of breath   . Myocardial infarction   . Diastolic CHF, acute on chronic 06/24/2011   Past Surgical History  Procedure Laterality Date  . Cataract extraction    . Tonsillectomy    . Non-stemi  2001    w/ a successful PTCA  . Stent of rca      unsuccessful PTCA  of left circumflex  . Adenoidectomy    . Tonsillectomy    . Eye surgery      cataracts B eyes   Social History:   reports that she has never smoked. She does not have any smokeless tobacco history on file. She reports that she does not drink alcohol or use illicit drugs.  Family History  Problem Relation Age of Onset  . Coronary artery disease    . Breast cancer Daughter   . Stroke Mother   . Heart attack Father     Medications: Patient's Medications  New Prescriptions   No medications on file  Previous Medications   BRINZOLAMIDE (AZOPT) 1 % OPHTHALMIC SUSPENSION    Place 1 drop into both eyes 2 (two) times daily.     DOCUSATE SODIUM (COLACE) 100 MG CAPSULE    Take 100 mg by mouth 2 (two) times daily.     DULOXETINE (CYMBALTA) 60 MG CAPSULE    Take 60 mg by mouth daily. * Do not open*   FUROSEMIDE (LASIX) 20 MG TABLET    Take 60 mg by mouth.   HYDROCODONE-ACETAMINOPHEN (NORCO/VICODIN) 5-325 MG PER TABLET    Take one tablet by mouth four times daily for pain   LATANOPROST (XALATAN) 0.005 % OPHTHALMIC SOLUTION    Place 1 drop into both eyes at bedtime.   LEVOTHYROXINE (SYNTHROID, LEVOTHROID) 75 MCG TABLET    Take 50 mcg by mouth daily before breakfast. Take 1/2 tablet by mouth at breakfast, check pulse weekly on Wednesday.   LOSARTAN (COZAAR) 100 MG TABLET    Take 100 mg by mouth daily.   METOPROLOL (LOPRESSOR) 50 MG TABLET    Take 1 tablet (50 mg total) by mouth 2 (two) times daily.   MULTIPLE VITAMINS-MINERALS (CERTAVITE/ANTIOXIDANTS PO)    Take 1 tablet by mouth daily. Certavite-anitoxidant 18 mg-0.4 mg    NUTRITIONAL SUPPLEMENTS (RESOURCE 2.0 PO)    Take 90 mLs by mouth 2 (two) times daily.  Modified Medications   No medications on file  Discontinued Medications   No medications on file     Physical Exam: Physical Exam  Constitutional: She appears well-developed and well-nourished. No distress.  HENT:  Head: Atraumatic.     Eyes: EOM are normal. Pupils are equal, round, and reactive to light.  Neck: Normal range of motion. Neck supple. No JVD present. No thyromegaly present.  Cardiovascular: Normal rate and regular rhythm.   Murmur heard.  Systolic murmur is present with a grade of 2/6  Pulmonary/Chest: Effort normal. She has no wheezes. She has rales in the left lower field.  Abdominal: Soft. Bowel sounds are normal. There is no tenderness.  Musculoskeletal: Normal range of motion. She exhibits no edema and no tenderness.  Lymphadenopathy:    She has no cervical adenopathy.  Neurological: She is alert. She has normal reflexes. No cranial nerve deficit. Coordination normal.  Skin: Skin is warm  and dry. No rash noted. She is not diaphoretic.     Filed Vitals:   12/17/13 1937  BP: 120/76  Pulse: 80  Temp: 98.1 F (36.7 C)  TempSrc: Tympanic  Resp: 16   Labs reviewed: Basic Metabolic Panel:  Recent Labs  02/15/13 05/31/13 08/21/13 09/25/13  NA 137 140 141  --   K 4.3 3.8 4.6  --   BUN 15 20 18   --   CREATININE 0.8 0.8 0.8  --   TSH  --  5.93*  --  3.32   CBC:  Recent Labs  05/31/13 08/21/13  WBC 9.5 9.9  HGB 12.0 11.2*  HCT 37 34*  PLT 407* 368    Past Procedures:  10/17/12 CXR CHF and right effusion.   Assessment/Plan Unspecified hypothyroidism Last TSH 5.932 05/31/13, increased Levothyroxine 69mcg daily, updated TSH 3.332 09/25/13    Unspecified constipation Stable on Colace 100mg  bid     Depression Stabilized in SNF and Cymbalta 60mg . She stated that she doesn't sleep well at night but declined sleeping aid.     Diastolic CHF, acute on chronic ompensated on Furosemide 60mg  daily, BNP 141.7 10/24/12, bun/crea 20/0.77 05/31/13 and 18/0.75 08/21/13    HYPERTENSION Controlled on Losartan 100mg  and Metoprolol 50mg  bid    OSTEOARTHRITIS akes Norco qid for pain control.       Family/ Staff Communication: comfort measures.   Goals of Care: SNF  Labs/tests ordered: none

## 2013-12-17 NOTE — Assessment & Plan Note (Addendum)
Stable on Colace 100mg bid   

## 2013-12-17 NOTE — Assessment & Plan Note (Signed)
ompensated on Furosemide 60mg  daily, BNP 141.7 10/24/12, bun/crea 20/0.77 05/31/13 and 18/0.75 08/21/13

## 2013-12-17 NOTE — Assessment & Plan Note (Signed)
Last TSH 5.932 05/31/13, increased Levothyroxine 50mcg daily, updated TSH 3.332 09/25/13   

## 2013-12-17 NOTE — Assessment & Plan Note (Signed)
Stabilized in SNF and Cymbalta 60mg. She stated that she doesn't sleep well at night but declined sleeping aid.    

## 2013-12-17 NOTE — Assessment & Plan Note (Signed)
akes Norco qid for pain control.

## 2013-12-17 NOTE — Assessment & Plan Note (Signed)
Controlled on Losartan 100mg and Metoprolol 50mg bid   

## 2013-12-31 ENCOUNTER — Other Ambulatory Visit: Payer: Self-pay | Admitting: *Deleted

## 2013-12-31 MED ORDER — HYDROCODONE-ACETAMINOPHEN 5-325 MG PO TABS: ORAL_TABLET | ORAL | Status: DC

## 2013-12-31 NOTE — Telephone Encounter (Signed)
[facility] 

## 2014-01-14 ENCOUNTER — Non-Acute Institutional Stay (SKILLED_NURSING_FACILITY): Payer: Medicare Other | Admitting: Nurse Practitioner

## 2014-01-14 ENCOUNTER — Encounter: Payer: Self-pay | Admitting: Nurse Practitioner

## 2014-01-14 DIAGNOSIS — F3289 Other specified depressive episodes: Secondary | ICD-10-CM

## 2014-01-14 DIAGNOSIS — F329 Major depressive disorder, single episode, unspecified: Secondary | ICD-10-CM

## 2014-01-14 DIAGNOSIS — K0889 Other specified disorders of teeth and supporting structures: Secondary | ICD-10-CM

## 2014-01-14 DIAGNOSIS — K089 Disorder of teeth and supporting structures, unspecified: Secondary | ICD-10-CM

## 2014-01-14 DIAGNOSIS — K5900 Constipation, unspecified: Secondary | ICD-10-CM

## 2014-01-14 DIAGNOSIS — I10 Essential (primary) hypertension: Secondary | ICD-10-CM

## 2014-01-14 DIAGNOSIS — I509 Heart failure, unspecified: Secondary | ICD-10-CM

## 2014-01-14 DIAGNOSIS — E039 Hypothyroidism, unspecified: Secondary | ICD-10-CM

## 2014-01-14 DIAGNOSIS — M1990 Unspecified osteoarthritis, unspecified site: Secondary | ICD-10-CM

## 2014-01-14 DIAGNOSIS — I5033 Acute on chronic diastolic (congestive) heart failure: Secondary | ICD-10-CM

## 2014-01-14 DIAGNOSIS — F32A Depression, unspecified: Secondary | ICD-10-CM

## 2014-01-14 NOTE — Assessment & Plan Note (Signed)
Stabilized in SNF and Cymbalta 60mg. She stated that she doesn't sleep well at night but declined sleeping aid.    

## 2014-01-14 NOTE — Progress Notes (Signed)
Patient ID: [name], female   DOB: 1914-05-28, 80 y.o.   MRN: [id number]    Patient ID: [name], female   DOB: 09-25-1913, 80 y.o.   MRN: [id number]  Code Status: DNR  Allergies  Allergen Reactions  . Aspirin Nausea Only  . Atorvastatin Nausea Only  . Benazepril     Not known  . Clopidogrel Bisulfate   . Flexeril [Cyclobenzaprine Hcl]   . Guanfacine     Not known.  Marland Kitchen Hctz [Hydrochlorothiazide]     Not known  . Ramipril     REACTION: cough    Chief Complaint  Patient presents with  . Medical Management of Chronic Issues  . Acute Visit    toothaches.    HPI: Patient is a 80 y.o. female seen in the SNF at [facility] today for evaluation of toothaches when biting down(19 cavities) and chronic medical conditions.  Problem List Items Addressed This Visit   Unspecified hypothyroidism     Levothyroxine 38mcg daily TSH 3.332 09/25/13       Unspecified constipation     Stable on Colace 100mg  bid       Toothache     Only with biting down when she eats. She was found to have 19 cavities per dentist. The POA delayed her tx. Will modify diet consistency for now. Observe the patient.     OSTEOARTHRITIS     takes Norco qid for pain control.      HYPERTENSION     Controlled on Losartan 100mg  and Metoprolol 50mg  bid      Diastolic CHF, acute on chronic - Primary     compensated on Furosemide 60mg  daily, BNP 141.7 10/24/12, bun/crea 20/0.77 05/31/13 and 18/0.75 08/21/13      Depression     Stabilized in SNF and Cymbalta 60mg . She stated that she doesn't sleep well at night but declined sleeping aid.           Review of Systems:  Review of Systems  Constitutional: Negative for fever, chills, weight loss, malaise/fatigue (chronic) and diaphoresis.  HENT: Positive for hearing loss (severe). Negative for congestion, ear pain and sore throat.        Toothaches when bites down-19 cavities per dentist-POA decided to delay for tx. Diet modification.  Erythematous gum noted-the patient denied pain when palpated.   Eyes: Negative for pain and discharge.  Respiratory: Positive for shortness of breath. Negative for cough, sputum production and wheezing.   Cardiovascular: Positive for PND (chronic). Negative for chest pain, palpitations, orthopnea, claudication and leg swelling.  Gastrointestinal: Negative for nausea, vomiting, abdominal pain, diarrhea and constipation.  Genitourinary: Positive for frequency (incontinent of bladder). Negative for dysuria, urgency and flank pain.  Musculoskeletal: Positive for back pain (chronic) and myalgias (chronic). Negative for falls.  Skin: Negative for itching and rash.  Neurological: Positive for weakness (generalized). Negative for dizziness, tremors, speech change, seizures, loss of consciousness and headaches.  Endo/Heme/Allergies: Negative for environmental allergies and polydipsia. Does not bruise/bleed easily.  Psychiatric/Behavioral: Positive for depression (flat affect) and memory loss. Negative for hallucinations. The patient is not nervous/anxious and does not have insomnia.      Past Medical History  Diagnosis Date  . Osteoarthritis   . Dyslipidemia   . Carotid stenosis   . Hypertension   . CAD (coronary artery disease)   . Glaucoma   . Hearing loss   . Carcinoma     basal cell  . Cataract   . Anemia   .  Macular degeneration   . Other abnormal glucose 2003  . Shortness of breath   . Myocardial infarction   . Diastolic CHF, acute on chronic 06/24/2011   Past Surgical History  Procedure Laterality Date  . Cataract extraction    . Tonsillectomy    . Non-stemi  2001    w/ a successful PTCA  . Stent of rca      unsuccessful PTCA of left circumflex  . Adenoidectomy    . Tonsillectomy    . Eye surgery      cataracts B eyes   Social History:   reports that she has never smoked. She does not have any smokeless tobacco history on file. She reports that she does not drink alcohol  or use illicit drugs.  Family History  Problem Relation Age of Onset  . Coronary artery disease    . Breast cancer Daughter   . Stroke Mother   . Heart attack Father     Medications: Patient's Medications  New Prescriptions   No medications on file  Previous Medications   BRINZOLAMIDE (AZOPT) 1 % OPHTHALMIC SUSPENSION    Place 1 drop into both eyes 2 (two) times daily.    DOCUSATE SODIUM (COLACE) 100 MG CAPSULE    Take 100 mg by mouth 2 (two) times daily.     DULOXETINE (CYMBALTA) 60 MG CAPSULE    Take 60 mg by mouth daily. * Do not open*   FUROSEMIDE (LASIX) 20 MG TABLET    Take 60 mg by mouth.   HYDROCODONE-ACETAMINOPHEN (NORCO/VICODIN) 5-325 MG PER TABLET    Take one tablet by mouth four times daily for pain   LATANOPROST (XALATAN) 0.005 % OPHTHALMIC SOLUTION    Place 1 drop into both eyes at bedtime.   LEVOTHYROXINE (SYNTHROID, LEVOTHROID) 75 MCG TABLET    Take 50 mcg by mouth daily before breakfast. Take 1/2 tablet by mouth at breakfast, check pulse weekly on Wednesday.   LOSARTAN (COZAAR) 100 MG TABLET    Take 100 mg by mouth daily.   METOPROLOL (LOPRESSOR) 50 MG TABLET    Take 1 tablet (50 mg total) by mouth 2 (two) times daily.   MULTIPLE VITAMINS-MINERALS (CERTAVITE/ANTIOXIDANTS PO)    Take 1 tablet by mouth daily. Certavite-anitoxidant 18 mg-0.4 mg    NUTRITIONAL SUPPLEMENTS (RESOURCE 2.0 PO)    Take 90 mLs by mouth 2 (two) times daily.  Modified Medications   No medications on file  Discontinued Medications   No medications on file     Physical Exam: Physical Exam  Constitutional: She appears well-developed and well-nourished. No distress.  HENT:  Head: Atraumatic.  Toothaches when bites down-19 cavities per dentist-POA decided to delay for tx. Diet modification. Erythematous gum noted-the patient denied pain when palpated.    Eyes: EOM are normal. Pupils are equal, round, and reactive to light.  Neck: Normal range of motion. Neck supple. No JVD present. No  thyromegaly present.  Cardiovascular: Normal rate and regular rhythm.   Murmur heard.  Systolic murmur is present with a grade of 2/6  Pulmonary/Chest: Effort normal. She has no wheezes. She has rales in the left lower field.  Abdominal: Soft. Bowel sounds are normal. There is no tenderness.  Musculoskeletal: Normal range of motion. She exhibits no edema and no tenderness.  Lymphadenopathy:    She has no cervical adenopathy.  Neurological: She is alert. She has normal reflexes. No cranial nerve deficit. Coordination normal.  Skin: Skin is warm and dry. No rash noted. She  is not diaphoretic.     Filed Vitals:   01/14/14 1530  BP: 124/88  Pulse: 84  Temp: 97.4 F (36.3 C)  TempSrc: Tympanic  Resp: 17   Labs reviewed: Basic Metabolic Panel:  Recent Labs  02/15/13 05/31/13 08/21/13 09/25/13  NA 137 140 141  --   K 4.3 3.8 4.6  --   BUN 15 20 18   --   CREATININE 0.8 0.8 0.8  --   TSH  --  5.93*  --  3.32   CBC:  Recent Labs  05/31/13 08/21/13  WBC 9.5 9.9  HGB 12.0 11.2*  HCT 37 34*  PLT 407* 368    Past Procedures:  10/17/12 CXR CHF and right effusion.   Assessment/Plan Depression Stabilized in SNF and Cymbalta 60mg . She stated that she doesn't sleep well at night but declined sleeping aid.      Diastolic CHF, acute on chronic compensated on Furosemide 60mg  daily, BNP 141.7 10/24/12, bun/crea 20/0.77 05/31/13 and 18/0.75 08/21/13    Unspecified hypothyroidism Levothyroxine 54mcg daily TSH 3.332 09/25/13     HYPERTENSION Controlled on Losartan 100mg  and Metoprolol 50mg  bid    Unspecified constipation Stable on Colace 100mg  bid     OSTEOARTHRITIS takes Norco qid for pain control.    Toothache Only with biting down when she eats. She was found to have 19 cavities per dentist. The POA delayed her tx. Will modify diet consistency for now. Observe the patient.     Family/ Staff Communication: comfort measures.   Goals of Care: SNF  Labs/tests  ordered: none

## 2014-01-14 NOTE — Assessment & Plan Note (Signed)
Controlled on Losartan 100mg and Metoprolol 50mg bid   

## 2014-01-14 NOTE — Assessment & Plan Note (Signed)
Stable on Colace 100mg bid   

## 2014-01-14 NOTE — Assessment & Plan Note (Signed)
takes Norco qid for pain control.    

## 2014-01-14 NOTE — Assessment & Plan Note (Signed)
compensated on Furosemide 60mg daily, BNP 141.7 10/24/12, bun/crea 20/0.77 05/31/13 and 18/0.75 08/21/13    

## 2014-01-14 NOTE — Assessment & Plan Note (Signed)
Levothyroxine 50mcg daily TSH 3.332 09/25/13   

## 2014-01-21 ENCOUNTER — Encounter: Payer: Self-pay | Admitting: Nurse Practitioner

## 2014-01-21 DIAGNOSIS — K0889 Other specified disorders of teeth and supporting structures: Secondary | ICD-10-CM | POA: Insufficient documentation

## 2014-01-21 NOTE — Assessment & Plan Note (Signed)
Only with biting down when she eats. She was found to have 19 cavities per dentist. The POA delayed her tx. Will modify diet consistency for now. Observe the patient.

## 2014-02-04 ENCOUNTER — Non-Acute Institutional Stay (SKILLED_NURSING_FACILITY): Payer: Medicare Other | Admitting: Nurse Practitioner

## 2014-02-04 ENCOUNTER — Encounter: Payer: Self-pay | Admitting: Nurse Practitioner

## 2014-02-04 DIAGNOSIS — D530 Protein deficiency anemia: Secondary | ICD-10-CM

## 2014-02-04 DIAGNOSIS — I5033 Acute on chronic diastolic (congestive) heart failure: Secondary | ICD-10-CM

## 2014-02-04 DIAGNOSIS — K5900 Constipation, unspecified: Secondary | ICD-10-CM

## 2014-02-04 DIAGNOSIS — F32A Depression, unspecified: Secondary | ICD-10-CM

## 2014-02-04 DIAGNOSIS — M1990 Unspecified osteoarthritis, unspecified site: Secondary | ICD-10-CM

## 2014-02-04 DIAGNOSIS — I509 Heart failure, unspecified: Secondary | ICD-10-CM

## 2014-02-04 DIAGNOSIS — I10 Essential (primary) hypertension: Secondary | ICD-10-CM

## 2014-02-04 DIAGNOSIS — E039 Hypothyroidism, unspecified: Secondary | ICD-10-CM

## 2014-02-04 DIAGNOSIS — F3289 Other specified depressive episodes: Secondary | ICD-10-CM

## 2014-02-04 DIAGNOSIS — F329 Major depressive disorder, single episode, unspecified: Secondary | ICD-10-CM

## 2014-02-04 NOTE — Assessment & Plan Note (Addendum)
Stable, Hgb 11.2 08/21/13. Update CBC and CMP

## 2014-02-04 NOTE — Assessment & Plan Note (Signed)
Stabilized in SNF, takes Cymbalta 60mg . She stated that she doesn't sleep well at night but declined sleeping aid. GDR: decrease Cymbalta to 30mg  daily. Observe for return of targeted symptoms.

## 2014-02-04 NOTE — Assessment & Plan Note (Signed)
Levothyroxine 27mcg daily TSH 3.332 09/25/13

## 2014-02-04 NOTE — Assessment & Plan Note (Signed)
takes Norco qid for pain control.

## 2014-02-04 NOTE — Assessment & Plan Note (Signed)
compensated on Furosemide 60mg  daily, BNP 141.7 10/24/12, bun/crea 20/0.77 05/31/13 and 18/0.75 08/21/13.update CMP

## 2014-02-04 NOTE — Progress Notes (Signed)
Patient ID: [name], female   DOB: [birth year], 80 y.o.   MRN: [id number]    Patient ID: [name], female   DOB: [birth year], 80 y.o.   MRN: [id number]  Code Status: DNR  Allergies  Allergen Reactions  . Aspirin Nausea Only  . Atorvastatin Nausea Only  . Benazepril     Not known  . Clopidogrel Bisulfate   . Flexeril [Cyclobenzaprine Hcl]   . Guanfacine     Not known.  Marland Kitchen Hctz [Hydrochlorothiazide]     Not known  . Ramipril     REACTION: cough    Chief Complaint  Patient presents with  . Medical Management of Chronic Issues  . Dementia    depression, pain, anemia    HPI: Patient is a 80 y.o. female seen in the SNF at [facility] today for evaluation of depression, chronic pain, and  chronic medical conditions.  Problem List Items Addressed This Visit   Unspecified hypothyroidism     Levothyroxine 62mcg daily TSH 3.332 09/25/13      Unspecified constipation     Stable on Colace 100mg  bid      OSTEOARTHRITIS     takes Norco qid for pain control.      HYPERTENSION     Controlled on Losartan 100mg  and Metoprolol 50mg  bid      Diastolic CHF, acute on chronic     compensated on Furosemide 60mg  daily, BNP 141.7 10/24/12, bun/crea 20/0.77 05/31/13 and 18/0.75 08/21/13.update CMP      Depression     Stabilized in SNF, takes Cymbalta 60mg . She stated that she doesn't sleep well at night but declined sleeping aid. GDR: decrease Cymbalta to 30mg  daily. Observe for return of targeted symptoms.         Relevant Medications      DULoxetine (CYMBALTA) 30 MG capsule   Anemia - Primary (Chronic)     Stable, Hgb 11.2 08/21/13. Update CBC and CMP       Review of Systems:  Review of Systems  Constitutional: Negative for fever, chills, weight loss, malaise/fatigue (chronic) and diaphoresis.  HENT: Positive for hearing loss (severe). Negative for congestion, ear pain and sore throat.        Toothaches when bites down-19 cavities per dentist-POA decided to  delay for tx. Diet modification. Erythematous gum noted-the patient denied pain when palpated.   Eyes: Negative for pain and discharge.  Respiratory: Positive for shortness of breath. Negative for cough, sputum production and wheezing.   Cardiovascular: Positive for PND (chronic). Negative for chest pain, palpitations, orthopnea, claudication and leg swelling.  Gastrointestinal: Negative for nausea, vomiting, abdominal pain, diarrhea and constipation.  Genitourinary: Positive for frequency (incontinent of bladder). Negative for dysuria, urgency and flank pain.  Musculoskeletal: Positive for back pain (chronic) and myalgias (chronic). Negative for falls.  Skin: Negative for itching and rash.  Neurological: Positive for weakness (generalized). Negative for dizziness, tremors, speech change, seizures, loss of consciousness and headaches.  Endo/Heme/Allergies: Negative for environmental allergies and polydipsia. Does not bruise/bleed easily.  Psychiatric/Behavioral: Positive for depression (flat affect) and memory loss. Negative for hallucinations. The patient is not nervous/anxious and does not have insomnia.      Past Medical History  Diagnosis Date  . Osteoarthritis   . Dyslipidemia   . Carotid stenosis   . Hypertension   . CAD (coronary artery disease)   . Glaucoma   . Hearing loss   . Carcinoma     basal cell  . Cataract   .  Anemia   . Macular degeneration   . Other abnormal glucose 2003  . Shortness of breath   . Myocardial infarction   . Diastolic CHF, acute on chronic 06/24/2011   Past Surgical History  Procedure Laterality Date  . Cataract extraction    . Tonsillectomy    . Non-stemi  2001    w/ a successful PTCA  . Stent of rca      unsuccessful PTCA of left circumflex  . Adenoidectomy    . Tonsillectomy    . Eye surgery      cataracts B eyes   Social History:   reports that she has never smoked. She does not have any smokeless tobacco history on file. She reports  that she does not drink alcohol or use illicit drugs.  Family History  Problem Relation Age of Onset  . Coronary artery disease    . Breast cancer Daughter   . Stroke Mother   . Heart attack Father     Medications: Patient's Medications  New Prescriptions   No medications on file  Previous Medications   BRINZOLAMIDE (AZOPT) 1 % OPHTHALMIC SUSPENSION    Place 1 drop into both eyes 2 (two) times daily.    DOCUSATE SODIUM (COLACE) 100 MG CAPSULE    Take 100 mg by mouth 2 (two) times daily.     DULOXETINE (CYMBALTA) 30 MG CAPSULE    Take 30 mg by mouth daily.   FUROSEMIDE (LASIX) 20 MG TABLET    Take 60 mg by mouth.   HYDROCODONE-ACETAMINOPHEN (NORCO/VICODIN) 5-325 MG PER TABLET    Take one tablet by mouth four times daily for pain   LATANOPROST (XALATAN) 0.005 % OPHTHALMIC SOLUTION    Place 1 drop into both eyes at bedtime.   LEVOTHYROXINE (SYNTHROID, LEVOTHROID) 75 MCG TABLET    Take 50 mcg by mouth daily before breakfast. Take 1/2 tablet by mouth at breakfast, check pulse weekly on Wednesday.   LOSARTAN (COZAAR) 100 MG TABLET    Take 100 mg by mouth daily.   METOPROLOL (LOPRESSOR) 50 MG TABLET    Take 1 tablet (50 mg total) by mouth 2 (two) times daily.   MULTIPLE VITAMINS-MINERALS (CERTAVITE/ANTIOXIDANTS PO)    Take 1 tablet by mouth daily. Certavite-anitoxidant 18 mg-0.4 mg    NUTRITIONAL SUPPLEMENTS (RESOURCE 2.0 PO)    Take 90 mLs by mouth 2 (two) times daily.  Modified Medications   No medications on file  Discontinued Medications   DULOXETINE (CYMBALTA) 60 MG CAPSULE    Take 60 mg by mouth daily. * Do not open*     Physical Exam: Physical Exam  Constitutional: She appears well-developed and well-nourished. No distress.  HENT:  Head: Atraumatic.  Toothaches when bites down-19 cavities per dentist-POA decided to delay for tx. Diet modification. Erythematous gum noted-the patient denied pain when palpated.    Eyes: EOM are normal. Pupils are equal, round, and reactive to  light.  Neck: Normal range of motion. Neck supple. No JVD present. No thyromegaly present.  Cardiovascular: Normal rate and regular rhythm.   Murmur heard.  Systolic murmur is present with a grade of 2/6  Pulmonary/Chest: Effort normal. She has no wheezes. She has rales in the left lower field.  Abdominal: Soft. Bowel sounds are normal. There is no tenderness.  Musculoskeletal: Normal range of motion. She exhibits no edema and no tenderness.  Lymphadenopathy:    She has no cervical adenopathy.  Neurological: She is alert. She has normal reflexes. No cranial nerve  deficit. Coordination normal.  Skin: Skin is warm and dry. No rash noted. She is not diaphoretic.     Filed Vitals:   02/04/14 1422  BP: 112/68  Pulse: 55  Temp: 98.1 F (36.7 C)  TempSrc: Tympanic  Resp: 16   Labs reviewed: Basic Metabolic Panel:  Recent Labs  02/15/13 05/31/13 08/21/13 09/25/13  NA 137 140 141  --   K 4.3 3.8 4.6  --   BUN 15 20 18   --   CREATININE 0.8 0.8 0.8  --   TSH  --  5.93*  --  3.32   CBC:  Recent Labs  05/31/13 08/21/13  WBC 9.5 9.9  HGB 12.0 11.2*  HCT 37 34*  PLT 407* 368    Past Procedures:  10/17/12 CXR CHF and right effusion.   Assessment/Plan Unspecified hypothyroidism Levothyroxine 35mcg daily TSH 3.332 09/25/13    OSTEOARTHRITIS takes Norco qid for pain control.    HYPERTENSION Controlled on Losartan 100mg  and Metoprolol 50mg  bid    Diastolic CHF, acute on chronic compensated on Furosemide 60mg  daily, BNP 141.7 10/24/12, bun/crea 20/0.77 05/31/13 and 18/0.75 08/21/13.update CMP    Anemia Stable, Hgb 11.2 08/21/13. Update CBC and CMP  Unspecified constipation Stable on Colace 100mg  bid    Depression Stabilized in SNF, takes Cymbalta 60mg . She stated that she doesn't sleep well at night but declined sleeping aid. GDR: decrease Cymbalta to 30mg  daily. Observe for return of targeted symptoms.         Family/ Staff Communication: comfort measures.    Goals of Care: SNF  Labs/tests ordered: CBC and CMP next lab.

## 2014-02-04 NOTE — Assessment & Plan Note (Signed)
Stable on Colace 100mg  bid

## 2014-02-04 NOTE — Assessment & Plan Note (Signed)
Controlled on Losartan 100mg  and Metoprolol 50mg  bid

## 2014-02-05 LAB — BASIC METABOLIC PANEL
BUN: 23 mg/dL — AB (ref 4–21)
CREATININE: 1 mg/dL (ref 0.5–1.1)
Glucose: 104 mg/dL
Potassium: 4.4 mmol/L (ref 3.4–5.3)
Sodium: 138 mmol/L (ref 137–147)

## 2014-02-05 LAB — CBC AND DIFFERENTIAL
HCT: 38 % (ref 36–46)
Hemoglobin: 12.4 g/dL (ref 12.0–16.0)
Platelets: 326 10*3/uL (ref 150–399)
WBC: 8.8 10*3/mL

## 2014-02-06 ENCOUNTER — Other Ambulatory Visit: Payer: Self-pay | Admitting: Nurse Practitioner

## 2014-03-06 ENCOUNTER — Encounter: Payer: Self-pay | Admitting: Nurse Practitioner

## 2014-03-06 ENCOUNTER — Non-Acute Institutional Stay (SKILLED_NURSING_FACILITY): Payer: Medicare Other | Admitting: Nurse Practitioner

## 2014-03-06 DIAGNOSIS — K5900 Constipation, unspecified: Secondary | ICD-10-CM

## 2014-03-06 DIAGNOSIS — F329 Major depressive disorder, single episode, unspecified: Secondary | ICD-10-CM

## 2014-03-06 DIAGNOSIS — I5033 Acute on chronic diastolic (congestive) heart failure: Secondary | ICD-10-CM

## 2014-03-06 DIAGNOSIS — E039 Hypothyroidism, unspecified: Secondary | ICD-10-CM

## 2014-03-06 DIAGNOSIS — I509 Heart failure, unspecified: Secondary | ICD-10-CM

## 2014-03-06 DIAGNOSIS — F3289 Other specified depressive episodes: Secondary | ICD-10-CM

## 2014-03-06 DIAGNOSIS — F32A Depression, unspecified: Secondary | ICD-10-CM

## 2014-03-06 DIAGNOSIS — M1990 Unspecified osteoarthritis, unspecified site: Secondary | ICD-10-CM

## 2014-03-06 DIAGNOSIS — I10 Essential (primary) hypertension: Secondary | ICD-10-CM

## 2014-03-06 NOTE — Assessment & Plan Note (Signed)
Controlled on Losartan 100mg  and Metoprolol 50mg  bid

## 2014-03-06 NOTE — Progress Notes (Signed)
Patient ID: [name], female   DOB: 10-24-1913, 80 y.o.   MRN: [id number]    Patient ID: [name], female   DOB: August 16, 1914, 80 y.o.   MRN: [id number]  Code Status: DNR  Allergies  Allergen Reactions  . Aspirin Nausea Only  . Atorvastatin Nausea Only  . Benazepril     Not known  . Clopidogrel Bisulfate   . Flexeril [Cyclobenzaprine Hcl]   . Guanfacine     Not known.  Marland Kitchen Hctz [Hydrochlorothiazide]     Not known  . Ramipril     REACTION: cough    Chief Complaint  Patient presents with  . Medical Management of Chronic Issues    HPI: Patient is a 80 y.o. female seen in the SNF at [facility] today for evaluation of chronic medical conditions.  Problem List Items Addressed This Visit   HYPERTENSION     Controlled on Losartan 100mg  and Metoprolol 50mg  bid       OSTEOARTHRITIS     takes Norco qid for pain control.       Diastolic CHF, acute on chronic     compensated on Furosemide 60mg  daily,  BNP 141.7 3/11.  23/1.02 02/05/14      Unspecified hypothyroidism     Levothyroxine 10mcg daily TSH 3.332 09/25/13      Depression - Primary     Stable. She stated that she doesn't sleep well at night but declined sleeping aid. GDR: decrease Cymbalta to 30mg  daily. Observe for return of targeted symptoms.       Unspecified constipation     Stable on Colace 100mg  bid         Review of Systems:  Review of Systems  Constitutional: Negative for fever, chills, weight loss, malaise/fatigue (chronic) and diaphoresis.  HENT: Positive for hearing loss (severe). Negative for congestion, ear pain and sore throat.        Toothaches when bites down-19 cavities per dentist-POA decided to delay for tx. Diet modification. Erythematous gum noted-the patient denied pain when palpated.   Eyes: Negative for pain and discharge.  Respiratory: Positive for shortness of breath. Negative for cough, sputum production and wheezing.   Cardiovascular: Positive for PND (chronic).  Negative for chest pain, palpitations, orthopnea, claudication and leg swelling.  Gastrointestinal: Negative for nausea, vomiting, abdominal pain, diarrhea and constipation.  Genitourinary: Positive for frequency (incontinent of bladder). Negative for dysuria, urgency and flank pain.  Musculoskeletal: Positive for back pain (chronic) and myalgias (chronic). Negative for falls.  Skin: Negative for itching and rash.  Neurological: Positive for weakness (generalized). Negative for dizziness, tremors, speech change, seizures, loss of consciousness and headaches.  Endo/Heme/Allergies: Negative for environmental allergies and polydipsia. Does not bruise/bleed easily.  Psychiatric/Behavioral: Positive for depression (flat affect) and memory loss. Negative for hallucinations. The patient is not nervous/anxious and does not have insomnia.      Past Medical History  Diagnosis Date  . Osteoarthritis   . Dyslipidemia   . Carotid stenosis   . Hypertension   . CAD (coronary artery disease)   . Glaucoma   . Hearing loss   . Carcinoma     basal cell  . Cataract   . Anemia   . Macular degeneration   . Other abnormal glucose 2003  . Shortness of breath   . Myocardial infarction   . Diastolic CHF, acute on chronic 06/24/2011   Past Surgical History  Procedure Laterality Date  . Cataract extraction    . Tonsillectomy    .  Non-stemi  2001    w/ a successful PTCA  . Stent of rca      unsuccessful PTCA of left circumflex  . Adenoidectomy    . Tonsillectomy    . Eye surgery      cataracts B eyes   Social History:   reports that she has never smoked. She does not have any smokeless tobacco history on file. She reports that she does not drink alcohol or use illicit drugs.  Family History  Problem Relation Age of Onset  . Coronary artery disease    . Breast cancer Daughter   . Stroke Mother   . Heart attack Father     Medications: Patient's Medications  New Prescriptions   No medications  on file  Previous Medications   BRINZOLAMIDE (AZOPT) 1 % OPHTHALMIC SUSPENSION    Place 1 drop into both eyes 2 (two) times daily.    DOCUSATE SODIUM (COLACE) 100 MG CAPSULE    Take 100 mg by mouth 2 (two) times daily.     DULOXETINE (CYMBALTA) 30 MG CAPSULE    Take 30 mg by mouth daily.   FUROSEMIDE (LASIX) 20 MG TABLET    Take 60 mg by mouth.   HYDROCODONE-ACETAMINOPHEN (NORCO/VICODIN) 5-325 MG PER TABLET    Take one tablet by mouth four times daily for pain   LATANOPROST (XALATAN) 0.005 % OPHTHALMIC SOLUTION    Place 1 drop into both eyes at bedtime.   LEVOTHYROXINE (SYNTHROID, LEVOTHROID) 75 MCG TABLET    Take 50 mcg by mouth daily before breakfast. Take 1/2 tablet by mouth at breakfast, check pulse weekly on Wednesday.   LOSARTAN (COZAAR) 100 MG TABLET    Take 100 mg by mouth daily.   METOPROLOL (LOPRESSOR) 50 MG TABLET    Take 1 tablet (50 mg total) by mouth 2 (two) times daily.   MULTIPLE VITAMINS-MINERALS (CERTAVITE/ANTIOXIDANTS PO)    Take 1 tablet by mouth daily. Certavite-anitoxidant 18 mg-0.4 mg    NUTRITIONAL SUPPLEMENTS (RESOURCE 2.0 PO)    Take 90 mLs by mouth 2 (two) times daily.  Modified Medications   No medications on file  Discontinued Medications   No medications on file     Physical Exam: Physical Exam  Constitutional: She appears well-developed and well-nourished. No distress.  HENT:  Head: Atraumatic.  Toothaches when bites down-19 cavities per dentist-POA decided to delay for tx. Diet modification. Erythematous gum noted-the patient denied pain when palpated.    Eyes: EOM are normal. Pupils are equal, round, and reactive to light.  Neck: Normal range of motion. Neck supple. No JVD present. No thyromegaly present.  Cardiovascular: Normal rate and regular rhythm.   Murmur heard.  Systolic murmur is present with a grade of 2/6  Pulmonary/Chest: Effort normal. She has no wheezes. She has rales in the left lower field.  Abdominal: Soft. Bowel sounds are normal.  There is no tenderness.  Musculoskeletal: Normal range of motion. She exhibits no edema and no tenderness.  Lymphadenopathy:    She has no cervical adenopathy.  Neurological: She is alert. She has normal reflexes. No cranial nerve deficit. Coordination normal.  Skin: Skin is warm and dry. No rash noted. She is not diaphoretic.     Filed Vitals:   03/06/14 1152  BP: 130/85  Pulse: 74  Temp: 98 F (36.7 C)  TempSrc: Tympanic  Resp: 16   Labs reviewed: Basic Metabolic Panel:  Recent Labs  05/31/13 08/21/13 09/25/13 02/05/14  NA 140 141  --  138  K 3.8 4.6  --  4.4  BUN 20 18  --  23*  CREATININE 0.8 0.8  --  1.0  TSH 5.93*  --  3.32  --    CBC:  Recent Labs  05/31/13 08/21/13 02/05/14  WBC 9.5 9.9 8.8  HGB 12.0 11.2* 12.4  HCT 37 34* 38  PLT 407* 368 326    Past Procedures:  10/17/12 CXR CHF and right effusion.   Assessment/Plan Depression Stable. She stated that she doesn't sleep well at night but declined sleeping aid. GDR: decrease Cymbalta to 30mg  daily. Observe for return of targeted symptoms.     Diastolic CHF, acute on chronic compensated on Furosemide 60mg  daily,  BNP 141.7 3/11.  23/1.02 02/05/14    Unspecified hypothyroidism Levothyroxine 76mcg daily TSH 3.332 09/25/13    OSTEOARTHRITIS takes Norco qid for pain control.     HYPERTENSION Controlled on Losartan 100mg  and Metoprolol 50mg  bid     Unspecified constipation Stable on Colace 100mg  bid      Family/ Staff Communication: comfort measures.   Goals of Care: SNF  Labs/tests ordered: none

## 2014-03-06 NOTE — Assessment & Plan Note (Signed)
Levothyroxine 57mcg daily TSH 3.332 09/25/13

## 2014-03-06 NOTE — Assessment & Plan Note (Signed)
Stable on Colace 100mg  bid

## 2014-03-06 NOTE — Assessment & Plan Note (Signed)
takes Norco qid for pain control.

## 2014-03-06 NOTE — Assessment & Plan Note (Signed)
Stable. She stated that she doesn't sleep well at night but declined sleeping aid. GDR: decrease Cymbalta to 30mg  daily. Observe for return of targeted symptoms.

## 2014-03-06 NOTE — Assessment & Plan Note (Signed)
compensated on Furosemide 60mg  daily,  BNP 141.7 3/11.  23/1.02 02/05/14

## 2014-04-01 ENCOUNTER — Non-Acute Institutional Stay (SKILLED_NURSING_FACILITY): Payer: Medicare Other | Admitting: Nurse Practitioner

## 2014-04-01 ENCOUNTER — Encounter: Payer: Self-pay | Admitting: Nurse Practitioner

## 2014-04-01 DIAGNOSIS — I509 Heart failure, unspecified: Secondary | ICD-10-CM

## 2014-04-01 DIAGNOSIS — F32A Depression, unspecified: Secondary | ICD-10-CM

## 2014-04-01 DIAGNOSIS — F3289 Other specified depressive episodes: Secondary | ICD-10-CM

## 2014-04-01 DIAGNOSIS — E039 Hypothyroidism, unspecified: Secondary | ICD-10-CM

## 2014-04-01 DIAGNOSIS — F329 Major depressive disorder, single episode, unspecified: Secondary | ICD-10-CM

## 2014-04-01 DIAGNOSIS — I10 Essential (primary) hypertension: Secondary | ICD-10-CM

## 2014-04-01 DIAGNOSIS — I5033 Acute on chronic diastolic (congestive) heart failure: Secondary | ICD-10-CM

## 2014-04-01 DIAGNOSIS — H01009 Unspecified blepharitis unspecified eye, unspecified eyelid: Secondary | ICD-10-CM

## 2014-04-01 DIAGNOSIS — M1990 Unspecified osteoarthritis, unspecified site: Secondary | ICD-10-CM

## 2014-04-01 DIAGNOSIS — H01003 Unspecified blepharitis right eye, unspecified eyelid: Secondary | ICD-10-CM | POA: Insufficient documentation

## 2014-04-01 DIAGNOSIS — H01006 Unspecified blepharitis left eye, unspecified eyelid: Principal | ICD-10-CM

## 2014-04-01 DIAGNOSIS — K5900 Constipation, unspecified: Secondary | ICD-10-CM

## 2014-04-01 NOTE — Assessment & Plan Note (Signed)
compensated on Furosemide 60mg  daily,  BNP 141.7 3/11.  23/1.02 02/05/14

## 2014-04-01 NOTE — Assessment & Plan Note (Signed)
Controlled on Losartan 100mg  and Metoprolol 50mg  bid

## 2014-04-01 NOTE — Assessment & Plan Note (Signed)
1/2 H2O and 1/2 baby shampoo mixture to scrum eyelashes bid. Observe.

## 2014-04-01 NOTE — Assessment & Plan Note (Signed)
Levothyroxine 98mcg daily TSH 3.332 09/25/13

## 2014-04-01 NOTE — Assessment & Plan Note (Signed)
Stabilized in SNF, takes Cymbalta 30mg  since 02/04/14.

## 2014-04-01 NOTE — Assessment & Plan Note (Signed)
Stable on Colace 100mg  bid

## 2014-04-01 NOTE — Assessment & Plan Note (Signed)
takes Norco qid for pain control.

## 2014-04-01 NOTE — Progress Notes (Signed)
Patient ID: [name], female   DOB: [birth year], 80 y.o.   MRN: [id number]    Patient ID: [name], female   DOB: February 13, 1914, 80 y.o.   MRN: [id number]  Code Status: DNR  Allergies  Allergen Reactions  . Aspirin Nausea Only  . Atorvastatin Nausea Only  . Benazepril     Not known  . Clopidogrel Bisulfate   . Flexeril [Cyclobenzaprine Hcl]   . Guanfacine     Not known.  Marland Kitchen Hctz [Hydrochlorothiazide]     Not known  . Ramipril     REACTION: cough    Chief Complaint  Patient presents with  . Medical Management of Chronic Issues  . Acute Visit    matted eyes with dry crusty discharge on eyelashes.     HPI: Patient is a 80 y.o. female seen in the SNF at [facility] today for evaluation of dry crusty drainage on eyelashes and matted eyes and chronic medical conditions.  Problem List Items Addressed This Visit   HYPERTENSION     Controlled on Losartan 100mg  and Metoprolol 50mg  bid      OSTEOARTHRITIS     takes Norco qid for pain control.       Diastolic CHF, acute on chronic     compensated on Furosemide 60mg  daily,  BNP 141.7 3/11.  23/1.02 02/05/14      Unspecified hypothyroidism     Levothyroxine 83mcg daily TSH 3.332 09/25/13      Depression     Stabilized in SNF, takes Cymbalta 30mg  since 02/04/14.       Unspecified constipation     Stable on Colace 100mg  bid       Blepharitis of both eyes - Primary     1/2 H2O and 1/2 baby shampoo mixture to scrum eyelashes bid. Observe.        Review of Systems:  Review of Systems  Constitutional: Negative for fever, chills, weight loss, malaise/fatigue (chronic) and diaphoresis.  HENT: Positive for hearing loss (severe). Negative for congestion, ear pain and sore throat.   Eyes: Positive for discharge. Negative for pain.       Dry crusty drainage on eyelashes. Matted eyes w/o injection.   Respiratory: Positive for shortness of breath. Negative for cough, sputum production and wheezing.     Cardiovascular: Positive for PND (chronic). Negative for chest pain, palpitations, orthopnea, claudication and leg swelling.  Gastrointestinal: Negative for nausea, vomiting, abdominal pain, diarrhea and constipation.  Genitourinary: Positive for frequency (incontinent of bladder). Negative for dysuria, urgency and flank pain.  Musculoskeletal: Positive for back pain (chronic) and myalgias (chronic). Negative for falls.  Skin: Negative for itching and rash.  Neurological: Positive for weakness (generalized). Negative for dizziness, tremors, speech change, seizures, loss of consciousness and headaches.  Endo/Heme/Allergies: Negative for environmental allergies and polydipsia. Does not bruise/bleed easily.  Psychiatric/Behavioral: Positive for depression (flat affect) and memory loss. Negative for hallucinations. The patient is not nervous/anxious and does not have insomnia.      Past Medical History  Diagnosis Date  . Osteoarthritis   . Dyslipidemia   . Carotid stenosis   . Hypertension   . CAD (coronary artery disease)   . Glaucoma   . Hearing loss   . Carcinoma     basal cell  . Cataract   . Anemia   . Macular degeneration   . Other abnormal glucose 2003  . Shortness of breath   . Myocardial infarction   . Diastolic CHF, acute on  chronic 06/24/2011   Past Surgical History  Procedure Laterality Date  . Cataract extraction    . Tonsillectomy    . Non-stemi  2001    w/ a successful PTCA  . Stent of rca      unsuccessful PTCA of left circumflex  . Adenoidectomy    . Tonsillectomy    . Eye surgery      cataracts B eyes   Social History:   reports that she has never smoked. She does not have any smokeless tobacco history on file. She reports that she does not drink alcohol or use illicit drugs.  Family History  Problem Relation Age of Onset  . Coronary artery disease    . Breast cancer Daughter   . Stroke Mother   . Heart attack Father     Medications: Patient's  Medications  New Prescriptions   No medications on file  Previous Medications   BRINZOLAMIDE (AZOPT) 1 % OPHTHALMIC SUSPENSION    Place 1 drop into both eyes 2 (two) times daily.    DOCUSATE SODIUM (COLACE) 100 MG CAPSULE    Take 100 mg by mouth 2 (two) times daily.     DULOXETINE (CYMBALTA) 30 MG CAPSULE    Take 30 mg by mouth daily.   FUROSEMIDE (LASIX) 20 MG TABLET    Take 60 mg by mouth.   HYDROCODONE-ACETAMINOPHEN (NORCO/VICODIN) 5-325 MG PER TABLET    Take one tablet by mouth four times daily for pain   LATANOPROST (XALATAN) 0.005 % OPHTHALMIC SOLUTION    Place 1 drop into both eyes at bedtime.   LEVOTHYROXINE (SYNTHROID, LEVOTHROID) 75 MCG TABLET    Take 50 mcg by mouth daily before breakfast. Take 1/2 tablet by mouth at breakfast, check pulse weekly on Wednesday.   LOSARTAN (COZAAR) 100 MG TABLET    Take 100 mg by mouth daily.   METOPROLOL (LOPRESSOR) 50 MG TABLET    Take 1 tablet (50 mg total) by mouth 2 (two) times daily.   MULTIPLE VITAMINS-MINERALS (CERTAVITE/ANTIOXIDANTS PO)    Take 1 tablet by mouth daily. Certavite-anitoxidant 18 mg-0.4 mg    NUTRITIONAL SUPPLEMENTS (RESOURCE 2.0 PO)    Take 90 mLs by mouth 2 (two) times daily.  Modified Medications   No medications on file  Discontinued Medications   No medications on file     Physical Exam: Physical Exam  Constitutional: She appears well-developed and well-nourished. No distress.  HENT:  Head: Atraumatic.     Eyes: EOM are normal. Pupils are equal, round, and reactive to light.  Dry crusty drainage on eyelashes. Matted eye. No conjunctival erythema.   Neck: Normal range of motion. Neck supple. No JVD present. No thyromegaly present.  Cardiovascular: Normal rate and regular rhythm.   Murmur heard.  Systolic murmur is present with a grade of 2/6  Pulmonary/Chest: Effort normal. She has no wheezes. She has rales in the left lower field.  Abdominal: Soft. Bowel sounds are normal. There is no tenderness.    Musculoskeletal: Normal range of motion. She exhibits no edema and no tenderness.  Lymphadenopathy:    She has no cervical adenopathy.  Neurological: She is alert. She has normal reflexes. No cranial nerve deficit. Coordination normal.  Skin: Skin is warm and dry. No rash noted. She is not diaphoretic.     Filed Vitals:   04/01/14 1259  BP: 138/64  Pulse: 72  Temp: 96.1 F (35.6 C)  TempSrc: Tympanic  Resp: 18   Labs reviewed: Basic Metabolic Panel:  Recent Labs  05/31/13 08/21/13 09/25/13 02/05/14  NA 140 141  --  138  K 3.8 4.6  --  4.4  BUN 20 18  --  23*  CREATININE 0.8 0.8  --  1.0  TSH 5.93*  --  3.32  --    CBC:  Recent Labs  05/31/13 08/21/13 02/05/14  WBC 9.5 9.9 8.8  HGB 12.0 11.2* 12.4  HCT 37 34* 38  PLT 407* 368 326    Past Procedures:  10/17/12 CXR CHF and right effusion.   Assessment/Plan Blepharitis of both eyes 1/2 H2O and 1/2 baby shampoo mixture to scrum eyelashes bid. Observe.   Depression Stabilized in SNF, takes Cymbalta 30mg  since 02/04/14.     HYPERTENSION Controlled on Losartan 100mg  and Metoprolol 50mg  bid    Diastolic CHF, acute on chronic compensated on Furosemide 60mg  daily,  BNP 141.7 3/11.  23/1.02 02/05/14    OSTEOARTHRITIS takes Norco qid for pain control.     Unspecified constipation Stable on Colace 100mg  bid     Unspecified hypothyroidism Levothyroxine 63mcg daily TSH 3.332 09/25/13      Family/ Staff Communication: comfort measures.   Goals of Care: SNF  Labs/tests ordered: none

## 2014-04-14 ENCOUNTER — Emergency Department (HOSPITAL_COMMUNITY): Payer: Medicare Other

## 2014-04-14 ENCOUNTER — Encounter (HOSPITAL_COMMUNITY): Payer: Self-pay | Admitting: Emergency Medicine

## 2014-04-14 ENCOUNTER — Inpatient Hospital Stay (HOSPITAL_COMMUNITY)
Admission: EM | Admit: 2014-04-14 | Discharge: 2014-04-15 | DRG: 065 | Disposition: A | Discharge status: Skilled Nursing Facility | Payer: Medicare Other | Attending: Internal Medicine | Admitting: Internal Medicine

## 2014-04-14 DIAGNOSIS — E039 Hypothyroidism, unspecified: Secondary | ICD-10-CM | POA: Diagnosis present

## 2014-04-14 DIAGNOSIS — Z515 Encounter for palliative care: Secondary | ICD-10-CM

## 2014-04-14 DIAGNOSIS — I509 Heart failure, unspecified: Secondary | ICD-10-CM | POA: Diagnosis present

## 2014-04-14 DIAGNOSIS — Z8249 Family history of ischemic heart disease and other diseases of the circulatory system: Secondary | ICD-10-CM

## 2014-04-14 DIAGNOSIS — I779 Disorder of arteries and arterioles, unspecified: Secondary | ICD-10-CM | POA: Diagnosis present

## 2014-04-14 DIAGNOSIS — E871 Hypo-osmolality and hyponatremia: Secondary | ICD-10-CM

## 2014-04-14 DIAGNOSIS — Z8669 Personal history of other diseases of the nervous system and sense organs: Secondary | ICD-10-CM

## 2014-04-14 DIAGNOSIS — Z9861 Coronary angioplasty status: Secondary | ICD-10-CM

## 2014-04-14 DIAGNOSIS — F32A Depression, unspecified: Secondary | ICD-10-CM

## 2014-04-14 DIAGNOSIS — H01003 Unspecified blepharitis right eye, unspecified eyelid: Secondary | ICD-10-CM

## 2014-04-14 DIAGNOSIS — I252 Old myocardial infarction: Secondary | ICD-10-CM

## 2014-04-14 DIAGNOSIS — Z66 Do not resuscitate: Secondary | ICD-10-CM | POA: Diagnosis present

## 2014-04-14 DIAGNOSIS — H3530 Unspecified macular degeneration: Secondary | ICD-10-CM | POA: Diagnosis present

## 2014-04-14 DIAGNOSIS — I5033 Acute on chronic diastolic (congestive) heart failure: Secondary | ICD-10-CM

## 2014-04-14 DIAGNOSIS — I5032 Chronic diastolic (congestive) heart failure: Secondary | ICD-10-CM | POA: Diagnosis present

## 2014-04-14 DIAGNOSIS — R4182 Altered mental status, unspecified: Secondary | ICD-10-CM

## 2014-04-14 DIAGNOSIS — Z87898 Personal history of other specified conditions: Secondary | ICD-10-CM

## 2014-04-14 DIAGNOSIS — I2510 Atherosclerotic heart disease of native coronary artery without angina pectoris: Secondary | ICD-10-CM | POA: Diagnosis present

## 2014-04-14 DIAGNOSIS — F329 Major depressive disorder, single episode, unspecified: Secondary | ICD-10-CM

## 2014-04-14 DIAGNOSIS — D72829 Elevated white blood cell count, unspecified: Secondary | ICD-10-CM | POA: Diagnosis present

## 2014-04-14 DIAGNOSIS — I6529 Occlusion and stenosis of unspecified carotid artery: Secondary | ICD-10-CM

## 2014-04-14 DIAGNOSIS — I639 Cerebral infarction, unspecified: Secondary | ICD-10-CM

## 2014-04-14 DIAGNOSIS — I10 Essential (primary) hypertension: Secondary | ICD-10-CM | POA: Diagnosis present

## 2014-04-14 DIAGNOSIS — C4490 Unspecified malignant neoplasm of skin, unspecified: Secondary | ICD-10-CM

## 2014-04-14 DIAGNOSIS — E739 Lactose intolerance, unspecified: Secondary | ICD-10-CM

## 2014-04-14 DIAGNOSIS — I6350 Cerebral infarction due to unspecified occlusion or stenosis of unspecified cerebral artery: Secondary | ICD-10-CM | POA: Diagnosis not present

## 2014-04-14 DIAGNOSIS — Z9849 Cataract extraction status, unspecified eye: Secondary | ICD-10-CM

## 2014-04-14 DIAGNOSIS — H9190 Unspecified hearing loss, unspecified ear: Secondary | ICD-10-CM | POA: Diagnosis present

## 2014-04-14 DIAGNOSIS — Z7401 Bed confinement status: Secondary | ICD-10-CM

## 2014-04-14 DIAGNOSIS — Z79899 Other long term (current) drug therapy: Secondary | ICD-10-CM

## 2014-04-14 DIAGNOSIS — K0889 Other specified disorders of teeth and supporting structures: Secondary | ICD-10-CM

## 2014-04-14 DIAGNOSIS — E785 Hyperlipidemia, unspecified: Secondary | ICD-10-CM

## 2014-04-14 DIAGNOSIS — H01006 Unspecified blepharitis left eye, unspecified eyelid: Secondary | ICD-10-CM

## 2014-04-14 DIAGNOSIS — K5900 Constipation, unspecified: Secondary | ICD-10-CM

## 2014-04-14 DIAGNOSIS — M1990 Unspecified osteoarthritis, unspecified site: Secondary | ICD-10-CM

## 2014-04-14 LAB — COMPREHENSIVE METABOLIC PANEL
ALT: 17 U/L (ref 0–35)
AST: 33 U/L (ref 0–37)
Albumin: 3.3 g/dL — ABNORMAL LOW (ref 3.5–5.2)
Alkaline Phosphatase: 95 U/L (ref 39–117)
Anion gap: 17 — ABNORMAL HIGH (ref 5–15)
BUN: 32 mg/dL — ABNORMAL HIGH (ref 6–23)
CALCIUM: 9.4 mg/dL (ref 8.4–10.5)
CO2: 23 meq/L (ref 19–32)
Chloride: 108 mEq/L (ref 96–112)
Creatinine, Ser: 0.99 mg/dL (ref 0.50–1.10)
GFR calc Af Amer: 54 mL/min — ABNORMAL LOW (ref >90)
GFR calc non Af Amer: 46 mL/min — ABNORMAL LOW (ref >90)
Glucose, Bld: 193 mg/dL — ABNORMAL HIGH (ref 70–99)
Potassium: 4 mEq/L (ref 3.7–5.3)
SODIUM: 148 meq/L — AB (ref 137–147)
Total Bilirubin: 0.5 mg/dL (ref 0.3–1.2)
Total Protein: 7.9 g/dL (ref 6.0–8.3)

## 2014-04-14 LAB — CBC WITH DIFFERENTIAL/PLATELET
BASOS PCT: 0 % (ref 0–1)
Basophils Absolute: 0 10*3/uL (ref 0.0–0.1)
Eosinophils Absolute: 0 10*3/uL (ref 0.0–0.7)
Eosinophils Relative: 0 % (ref 0–5)
HCT: 41.3 % (ref 36.0–46.0)
Hemoglobin: 13.1 g/dL (ref 12.0–15.0)
Lymphocytes Relative: 4 % — ABNORMAL LOW (ref 12–46)
Lymphs Abs: 0.6 10*3/uL — ABNORMAL LOW (ref 0.7–4.0)
MCH: 30.5 pg (ref 26.0–34.0)
MCHC: 31.7 g/dL (ref 30.0–36.0)
MCV: 96 fL (ref 78.0–100.0)
Monocytes Absolute: 0.5 10*3/uL (ref 0.1–1.0)
Monocytes Relative: 3 % (ref 3–12)
NEUTROS PCT: 93 % — AB (ref 43–77)
Neutro Abs: 15.5 10*3/uL — ABNORMAL HIGH (ref 1.7–7.7)
Platelets: 336 10*3/uL (ref 150–400)
RBC: 4.3 MIL/uL (ref 3.87–5.11)
RDW: 13.5 % (ref 11.5–15.5)
WBC: 16.6 10*3/uL — ABNORMAL HIGH (ref 4.0–10.5)

## 2014-04-14 MED ORDER — LORAZEPAM 2 MG/ML IJ SOLN
1.0000 mg | INTRAMUSCULAR | Status: DC | PRN
  Filled 2014-04-14: qty 1

## 2014-04-14 MED ORDER — LORAZEPAM 2 MG/ML IJ SOLN
0.5000 mg | Freq: Three times a day (TID) | INTRAMUSCULAR | Status: DC
  Administered 2014-04-14 – 2014-04-15 (×3): 0.5 mg via INTRAVENOUS
  Filled 2014-04-14 (×2): qty 1

## 2014-04-14 MED ORDER — SODIUM CHLORIDE 0.9 % IV SOLN
INTRAVENOUS | Status: DC
  Administered 2014-04-14 – 2014-04-15 (×2): via INTRAVENOUS

## 2014-04-14 MED ORDER — MORPHINE SULFATE 2 MG/ML IJ SOLN: 1.0000 mg | INTRAMUSCULAR | Status: DC | PRN

## 2014-04-14 MED ORDER — MORPHINE SULFATE 2 MG/ML IJ SOLN: 2.0000 mg | INTRAMUSCULAR | Status: DC | PRN

## 2014-04-14 MED ORDER — DIAZEPAM 5 MG/ML IJ SOLN
5.0000 mg | INTRAMUSCULAR | Status: DC | PRN
  Administered 2014-04-14: 5 mg via INTRAVENOUS
  Filled 2014-04-14: qty 2

## 2014-04-14 MED ORDER — SODIUM CHLORIDE 0.9 % IV BOLUS (SEPSIS)
1000.0000 mL | Freq: Once | INTRAVENOUS | Status: AC
  Administered 2014-04-14: 1000 mL via INTRAVENOUS

## 2014-04-14 MED ORDER — STROKE: EARLY STAGES OF RECOVERY BOOK
Freq: Once | Status: DC
  Filled 2014-04-14: qty 1

## 2014-04-14 MED ORDER — MORPHINE SULFATE 2 MG/ML IJ SOLN
2.0000 mg | INTRAMUSCULAR | Status: DC | PRN
  Administered 2014-04-14: 2 mg via INTRAVENOUS
  Filled 2014-04-14: qty 1

## 2014-04-14 MED ORDER — ATROPINE SULFATE 1 % OP SOLN
2.0000 [drp] | OPHTHALMIC | Status: DC | PRN
  Filled 2014-04-14: qty 2

## 2014-04-14 MED ORDER — ONDANSETRON HCL 4 MG/2ML IJ SOLN: 4.0000 mg | Freq: Three times a day (TID) | INTRAMUSCULAR | Status: DC | PRN

## 2014-04-14 NOTE — ED Notes (Signed)
attempted to call report

## 2014-04-14 NOTE — ED Notes (Signed)
Per EMS pt from [facility] report of unconsciousness. Pt given 2mg  Narcan en route and EMS noted pt to be more alert. Pt has a R side gaze, pupils pin point prior to narcan, pt is a DNR. EMS vitals BP 218/124, 90% Rm Air, 100% Non-rebreather 10L, LBBB/NS. Pt has hx of CHF, HTN, Alzheimer.

## 2014-04-14 NOTE — H&P (Signed)
PATIENT DETAILS Name: [name] Age: 80 y.o. Sex: female Date of Birth: 11-Mar-1914 Admit Date: 04/14/2014 NID:[id number], Viviann Spare, MD   CHIEF COMPLAINT:  Decreased level of consciousness  HPI: [name] is a 80 y.o. female with a Past Medical History of dementia, depression, bilateral severe hearing loss, incontinence of both bladder and bowel, coronary artery disease, bilateral carotid stenosis number macular degeneration-with significant loss of vision bilaterally, completely dependent on primary caregivers for all daily activities of living, essentially bed bound who presents today from her skilled nursing facility with the above noted complaint. Please note, patient is unable to participate in the history taking process, as is the most of this history is taken from the ED chart. Apparently, patient was found in a skilled nursing facility with significantly decreased level of consciousness, and transported to the emergency room. She was given Narcan with very minimal improvement in symptoms. In the ED, she was found to have left-sided deficits, and neglect, a CT scan of the head showed a large right MCA infarct. Family-2 sons at bedside, did not want to commence any stroke workup, did not want any treatment (including ASA), and wanted just comfort care. The hospitalist service was then asked to admit this patient for further evaluation and comfort care.   ALLERGIES:   Allergies  Allergen Reactions  . Aspirin Nausea Only  . Atorvastatin Nausea Only  . Benazepril     Not known  . Clopidogrel Bisulfate     Unknown rxn  . Flexeril [Cyclobenzaprine Hcl]   . Guanfacine     Not known.  Marland Kitchen Hctz [Hydrochlorothiazide]     Not known  . Nitroglycerin     Unknown rxn  . Ramipril     REACTION: cough    PAST MEDICAL HISTORY: Past Medical History  Diagnosis Date  . Osteoarthritis   . Dyslipidemia   . Carotid stenosis   . Hypertension   . CAD (coronary artery disease)   .  Glaucoma   . Hearing loss   . Carcinoma     basal cell  . Cataract   . Anemia   . Macular degeneration   . Other abnormal glucose 2003  . Shortness of breath   . Myocardial infarction   . Diastolic CHF, acute on chronic 06/24/2011    PAST SURGICAL HISTORY: Past Surgical History  Procedure Laterality Date  . Cataract extraction    . Tonsillectomy    . Non-stemi  2001    w/ a successful PTCA  . Stent of rca      unsuccessful PTCA of left circumflex  . Adenoidectomy    . Tonsillectomy    . Eye surgery      cataracts B eyes    MEDICATIONS AT HOME: Prior to Admission medications   Medication Sig Start Date End Date Taking? Authorizing Provider  brinzolamide (AZOPT) 1 % ophthalmic suspension Place 1 drop into both eyes 2 (two) times daily.    Yes Historical Provider, MD  docusate sodium (COLACE) 100 MG capsule Take 100 mg by mouth 2 (two) times daily.    Yes Historical Provider, MD  DULoxetine (CYMBALTA) 30 MG capsule Take 30 mg by mouth daily.   Yes Historical Provider, MD  furosemide (LASIX) 20 MG tablet Take 60 mg by mouth daily.    Yes Historical Provider, MD  HYDROcodone-acetaminophen (NORCO/VICODIN) 5-325 MG per tablet Take 1 tablet by mouth every 6 (six) hours.   Yes Historical Provider, MD  latanoprost (XALATAN) 0.005 % ophthalmic solution Place 1 drop into both eyes at bedtime.   Yes Historical Provider, MD  levothyroxine (SYNTHROID, LEVOTHROID) 50 MCG tablet Take 50 mcg by mouth daily before breakfast.   Yes Historical Provider, MD  losartan (COZAAR) 100 MG tablet Take 100 mg by mouth daily.   Yes Historical Provider, MD  metoprolol (LOPRESSOR) 50 MG tablet Take 50 mg by mouth 2 (two) times daily.   Yes Historical Provider, MD  Multiple Vitamins-Minerals (CERTAVITE/ANTIOXIDANTS PO) Take 1 tablet by mouth daily. Certavite-anitoxidant 18 mg-0.4 mg   Yes Historical Provider, MD  Nutritional Supplements (RESOURCE 2.0 PO) Take 90 mLs by mouth 2 (two) times daily.   Yes  Historical Provider, MD    FAMILY HISTORY: Family History  Problem Relation Age of Onset  . Coronary artery disease    . Breast cancer Daughter   . Stroke Mother   . Heart attack Father    SOCIAL HISTORY:  reports that she has never smoked. She does not have any smokeless tobacco history on file. She reports that she does not drink alcohol or use illicit drugs.  REVIEW OF SYSTEMS:  Unable to obtain  PHYSICAL EXAM: Blood pressure 193/98, pulse 110, temperature 98.3 F (36.8 C), temperature source Axillary, resp. rate 22, SpO2 100.00%.  General appearance : Lethargic, does not respond to any verbal stimuli-moves right upper and lower extremities more than left.  HEENT: Atraumatic and Normocephalic, pupils equally reactive to light and accomodation Neck: supple Chest:Good air entry bilaterally, no added sounds  CVS: S1 S2 regular Abdomen: Bowel sounds present, Non tender and not distended with no gaurding, rigidity or rebound. Extremities: B/L Lower Ext shows no edema, both legs are warm to touch Neurology: Very difficult exam, patient appears to have prior flexion contractures in her left upper extremity, does not follow any commands, lethargic. However on exam, appears to have left-sided deficits more than right. However at times spontaneously moves her extremities a mild right more than left. Skin:No Rash  LABS ON ADMISSION:   Recent Labs  04/14/14 1602  NA 148*  K 4.0  CL 108  CO2 23  GLUCOSE 193*  BUN 32*  CREATININE 0.99  CALCIUM 9.4    Recent Labs  04/14/14 1602  AST 33  ALT 17  ALKPHOS 95  BILITOT 0.5  PROT 7.9  ALBUMIN 3.3*   No results found for this basename: LIPASE, AMYLASE,  in the last 72 hours  Recent Labs  04/14/14 1602  WBC 16.6*  NEUTROABS 15.5*  HGB 13.1  HCT 41.3  MCV 96.0  PLT 336   No results found for this basename: CKTOTAL, CKMB, CKMBINDEX, TROPONINI,  in the last 72 hours No results found for this basename: DDIMER,  in the  last 72 hours No components found with this basename: POCBNP,    RADIOLOGIC STUDIES ON ADMISSION: Ct Head Wo Contrast  04/14/2014   CLINICAL DATA:  80 year old female found down. Gaze deviation. Initial encounter.  EXAM: CT HEAD WITHOUT CONTRAST  TECHNIQUE: Contiguous axial images were obtained from the base of the skull through the vertex without intravenous contrast.  COMPARISON:  Brain MRI and non contrast head CT 12/26/2009  FINDINGS: Visualized paranasal sinuses and mastoids are clear. No acute osseous abnormality identified.  No acute orbit or scalp soft tissue finding identified.  Chronic Calcified atherosclerosis at the skull base. Small area of chronic encephalomalacia in the right superior frontal gyrus is stable since 2011 (series 9, image 22 on MRI at  that time). There is superimposed increase generalized cerebral white matter hypodensity.  Furthermore, there is generalized hypodensity in the right MCA territory most apparent on series 201, image 18. Suspicious increased density at the distal right M1 segment and posterior right sylvian division branch (images 13 and 14). Mild if any associated mass effect at this time. No associated hemorrhage.  No other acute cortically based infarct identified. Generalized volume loss since 2011. No ventriculomegaly. Increased brainstem heterogeneity since that time probably due to chronic small vessel disease. No midline shift, intracranial mass effect or discrete mass lesion.  IMPRESSION: 1. Acute/evolving right MCA infarct with evidence of right MCA M1/M2 clot. 2. No associated hemorrhage. No significant mass effect at this time. Study discussed by telephone with Dr. Elnora Morrison on 04/14/2014 at 1558 hrs.   Electronically Signed   By: Lars Pinks M.D.   On: 04/14/2014 16:00     EKG: Independently reviewed. Sinus tachycardia  ASSESSMENT AND PLAN: Present on Admission:  . Acute ischemic stroke . Unspecified hypothyroidism . HYPERTENSION . HEARING  LOSS . CORONARY ARTERY DISEASE . CAROTID STENOSIS . Chronic diastolic CHF (congestive heart failure)   This unfortunate 80 year old female who is completely dependent on her caregivers for daily activities of living, comes in with decreased mentation/consciousness than her usual baseline. CT of the head shows a large right MCA infarct. 2 sons at bedside present, per them patient already has a very poor quality of life-severe deafness, barely hears out of her right ear, has macular degeneration and has severe decrease in vision, she has been essentially bedbound for the past few months. They do not want any workup for CVA including MRI, echo, Dopplers. They do not want any further blood work. They do not want any treatment including aspirin. They want to focus on comfort. For now, patient will be admitted to a MedSurg bed, we will get a swallow evaluation in the morning, if it is determined that she is unable to swallow-then she would likely need residential hospice placement. Palliative care consultation has been placed. We will place on low-dose scheduled Ativan, when necessary morphine and Ativan as well. Family understands very poor overall prognosis.  Further plan will depend as patient's clinical course evolves and further radiologic and laboratory data become available. Patient will be monitored closely.  Above noted plan was discussed with 2 son's , they were in agreement.   DVT Prophylaxis: None-comfort care  Code Status: DNR-reconfirmed with family, out of facility DNR also present  Total time spent for admission equals 45 minutes.  [facility] Pager [phone]  If 7PM-7AM, please contact night-coverage [email or web address] Password [email or web address] 04/14/2014, 5:20 PM  **Disclaimer: This note may have been dictated with voice recognition software. Similar sounding words can inadvertently be transcribed and this note may contain transcription errors which may not have been  corrected upon publication of note.**

## 2014-04-14 NOTE — Progress Notes (Signed)
Received pt. From ED.  VS stable.  Lethargic and does not respond to voice but respond to pain stimuli.  Daughter at bedside. Will endorse to night shift nurse.Marland Kitchen

## 2014-04-14 NOTE — ED Provider Notes (Signed)
CSN: [id number]     Arrival date & time 04/14/14  1507 History   First MD Initiated Contact with Patient 04/14/14 1515     Chief Complaint  Patient presents with  . Altered Mental Status     (Consider location/radiation/quality/duration/timing/severity/associated sxs/prior Treatment) HPI Comments: 80 year old female with history of dementia, hyponatremia, anemia, nursing home, CAD, DO NOT RESUSCITATE presents from friend's nursing home after report of unconsciousness and altered. On route patient was given Narcan with reported mild improvement. Unknown onset time. Blood pressure elevated on route and hypoxic requiring 10 L. No family at bedside. Patient unable to give details to 2 nonverbal and altered.  Patient is a 80 y.o. female presenting with altered mental status. The history is provided by the EMS personnel.  Altered Mental Status   Past Medical History  Diagnosis Date  . Osteoarthritis   . Dyslipidemia   . Carotid stenosis   . Hypertension   . CAD (coronary artery disease)   . Glaucoma   . Hearing loss   . Carcinoma     basal cell  . Cataract   . Anemia   . Macular degeneration   . Other abnormal glucose 2003  . Shortness of breath   . Myocardial infarction   . Diastolic CHF, acute on chronic 06/24/2011   Past Surgical History  Procedure Laterality Date  . Cataract extraction    . Tonsillectomy    . Non-stemi  2001    w/ a successful PTCA  . Stent of rca      unsuccessful PTCA of left circumflex  . Adenoidectomy    . Tonsillectomy    . Eye surgery      cataracts B eyes   Family History  Problem Relation Age of Onset  . Coronary artery disease    . Breast cancer Daughter   . Stroke Mother   . Heart attack Father    History  Substance Use Topics  . Smoking status: Never Smoker   . Smokeless tobacco: Not on file  . Alcohol Use: No     Comment: rare    OB History   Grav Para Term Preterm Abortions TAB SAB Ect Mult Living                 Review of  Systems  Unable to perform ROS: Patient nonverbal      Allergies  Aspirin; Atorvastatin; Benazepril; Clopidogrel bisulfate; Flexeril; Guanfacine; Hctz; Nitroglycerin; and Ramipril  Home Medications   Prior to Admission medications   Medication Sig Start Date End Date Taking? Authorizing Provider  brinzolamide (AZOPT) 1 % ophthalmic suspension Place 1 drop into both eyes 2 (two) times daily.    Yes Historical Provider, MD  docusate sodium (COLACE) 100 MG capsule Take 100 mg by mouth 2 (two) times daily.    Yes Historical Provider, MD  DULoxetine (CYMBALTA) 30 MG capsule Take 30 mg by mouth daily.   Yes Historical Provider, MD  furosemide (LASIX) 20 MG tablet Take 60 mg by mouth daily.    Yes Historical Provider, MD  HYDROcodone-acetaminophen (NORCO/VICODIN) 5-325 MG per tablet Take 1 tablet by mouth every 6 (six) hours.   Yes Historical Provider, MD  latanoprost (XALATAN) 0.005 % ophthalmic solution Place 1 drop into both eyes at bedtime.   Yes Historical Provider, MD  levothyroxine (SYNTHROID, LEVOTHROID) 50 MCG tablet Take 50 mcg by mouth daily before breakfast.   Yes Historical Provider, MD  losartan (COZAAR) 100 MG tablet Take 100 mg by mouth daily.  Yes Historical Provider, MD  metoprolol (LOPRESSOR) 50 MG tablet Take 50 mg by mouth 2 (two) times daily.   Yes Historical Provider, MD  Multiple Vitamins-Minerals (CERTAVITE/ANTIOXIDANTS PO) Take 1 tablet by mouth daily. Certavite-anitoxidant 18 mg-0.4 mg   Yes Historical Provider, MD  Nutritional Supplements (RESOURCE 2.0 PO) Take 90 mLs by mouth 2 (two) times daily.   Yes Historical Provider, MD   BP 179/103  Pulse 107  Temp(Src) 98.3 F (36.8 C) (Axillary)  Resp 22  SpO2 100% Physical Exam  Nursing note and vitals reviewed. Constitutional: She appears well-developed. She appears distressed.  HENT:  Head: Normocephalic and atraumatic.  Dry mucous membranes  Eyes: Right eye exhibits discharge. Left eye exhibits discharge. No  scleral icterus.  Eyes crusted, bilateral right gaze, pupils minimally responsive bilateral.  Neck: No tracheal deviation present.  Cardiovascular: Tachycardia present.   Pulmonary/Chest: She has no wheezes. She has rales (few at bases bilateral tachypnea).  Abdominal: Soft. She exhibits no distension. There is no guarding.  Musculoskeletal:  Patient is not lift arms or legs against gravity. Left arm flexion. Bilateral lower extremities flexed.  Neurological: A cranial nerve deficit is present.  Patient has left arm flexion. Patient not spontaneously moving any of her extremities. Mild flexion of both lower extremities bilateral. Sensation to pain intact. Eyes open to loud verbal and pain. Difficult neuro exam good presentation. Nonverbal  Skin: Skin is warm.  Psychiatric:  Altered non verbal    ED Course  Procedures (including critical care time) CRITICAL CARE Performed by: Mariea Clonts   Total critical care time: 30  min  Critical care time was exclusive of separately billable procedures and treating other patients.  Critical care was necessary to treat or prevent imminent or life-threatening deterioration.  Critical care was time spent personally by me on the following activities: development of treatment plan with patient and/or surrogate as well as nursing, discussions with consultants, evaluation of patient's response to treatment, examination of patient, obtaining history from patient or surrogate, ordering and performing treatments and interventions, ordering and review of laboratory studies, ordering and review of radiographic studies, pulse oximetry and re-evaluation of patient's condition.  Labs Review Labs Reviewed  COMPREHENSIVE METABOLIC PANEL - Abnormal; Notable for the following:    Sodium 148 (*)    Glucose, Bld 193 (*)    BUN 32 (*)    Albumin 3.3 (*)    GFR calc non Af Amer 46 (*)    GFR calc Af Amer 54 (*)    Anion gap 17 (*)    All other components  within normal limits  CBC WITH DIFFERENTIAL - Abnormal; Notable for the following:    WBC 16.6 (*)    Neutrophils Relative % 93 (*)    Neutro Abs 15.5 (*)    Lymphocytes Relative 4 (*)    Lymphs Abs 0.6 (*)    All other components within normal limits    Imaging Review Ct Head Wo Contrast  04/14/2014   CLINICAL DATA:  80 year old female found down. Gaze deviation. Initial encounter.  EXAM: CT HEAD WITHOUT CONTRAST  TECHNIQUE: Contiguous axial images were obtained from the base of the skull through the vertex without intravenous contrast.  COMPARISON:  Brain MRI and non contrast head CT 12/26/2009  FINDINGS: Visualized paranasal sinuses and mastoids are clear. No acute osseous abnormality identified.  No acute orbit or scalp soft tissue finding identified.  Chronic Calcified atherosclerosis at the skull base. Small area of chronic encephalomalacia in the  right superior frontal gyrus is stable since 2011 (series 9, image 22 on MRI at that time). There is superimposed increase generalized cerebral white matter hypodensity.  Furthermore, there is generalized hypodensity in the right MCA territory most apparent on series 201, image 18. Suspicious increased density at the distal right M1 segment and posterior right sylvian division branch (images 13 and 14). Mild if any associated mass effect at this time. No associated hemorrhage.  No other acute cortically based infarct identified. Generalized volume loss since 2011. No ventriculomegaly. Increased brainstem heterogeneity since that time probably due to chronic small vessel disease. No midline shift, intracranial mass effect or discrete mass lesion.  IMPRESSION: 1. Acute/evolving right MCA infarct with evidence of right MCA M1/M2 clot. 2. No associated hemorrhage. No significant mass effect at this time. Study discussed by telephone with Dr. Elnora Morrison on 04/14/2014 at 1558 hrs.   Electronically Signed   By: Lars Pinks M.D.   On: 04/14/2014 16:00     EKG  Interpretation   Date/Time:  Sunday April 14 2014 15:18:58 EDT Ventricular Rate:  115 PR Interval:  123 QRS Duration: 137 QT Interval:  386 QTC Calculation: 534 R Axis:   -68 Text Interpretation:  Sinus tachycardia Left bundle branch block Confirmed  by Analyn Matusek  MD, Deajah Erkkila ([id number]) on 04/14/2014 4:01:57 PM      MDM   Final diagnoses:  Acute ischemic stroke  Altered mental status, unspecified altered mental status type  DNR (do not resuscitate)   Patient presented with altered mental status, hypertension and hypoxia. Patient and the nursing home will try to obtain further details, unknown onset time. DO NOT RESUSCITATE in her chart.  Differential broad including infection/sepsis, stroke, metabolic, other. Clinically patient has a stroke presentation CT head pending. Neurology will be consult at.  Daily arrived, patient's 2 sons. Long discussion regarding patient and family wishes. With clinically large stroke decision to cancel further testing and treat with comfort care. Morphine ordered when necessary. Medicine consult at for general medicine bed and comfort care. Discussed with palliative care.  The patients results and plan were reviewed and discussed.   Any x-rays performed were personally reviewed by myself.   Differential diagnosis were considered with the presenting HPI.  Medications  sodium chloride 0.9 % bolus 1,000 mL (not administered)  morphine 2 MG/ML injection 2 mg (not administered)     Filed Vitals:   04/14/14 1507 04/14/14 1512 04/14/14 1515 04/14/14 1530  BP:  179/103 179/103 193/98  Pulse:  107 110 110  Temp:  98.3 F (36.8 C)    TempSrc:  Axillary    Resp:  22    SpO2: 100% 100% 99% 100%    Admission/ observation were discussed with the admitting physician, patient and/or family and they are comfortable with the plan.     Mariea Clonts, MD 04/14/14 [phone]

## 2014-04-15 ENCOUNTER — Encounter: Payer: Self-pay | Admitting: Nurse Practitioner

## 2014-04-15 ENCOUNTER — Non-Acute Institutional Stay (SKILLED_NURSING_FACILITY): Payer: Medicare Other | Admitting: Nurse Practitioner

## 2014-04-15 DIAGNOSIS — Z515 Encounter for palliative care: Secondary | ICD-10-CM

## 2014-04-15 DIAGNOSIS — F329 Major depressive disorder, single episode, unspecified: Secondary | ICD-10-CM

## 2014-04-15 DIAGNOSIS — I5032 Chronic diastolic (congestive) heart failure: Secondary | ICD-10-CM

## 2014-04-15 DIAGNOSIS — I5033 Acute on chronic diastolic (congestive) heart failure: Secondary | ICD-10-CM

## 2014-04-15 DIAGNOSIS — I10 Essential (primary) hypertension: Secondary | ICD-10-CM

## 2014-04-15 DIAGNOSIS — R4182 Altered mental status, unspecified: Secondary | ICD-10-CM

## 2014-04-15 DIAGNOSIS — Z66 Do not resuscitate: Secondary | ICD-10-CM

## 2014-04-15 DIAGNOSIS — F3289 Other specified depressive episodes: Secondary | ICD-10-CM

## 2014-04-15 DIAGNOSIS — E785 Hyperlipidemia, unspecified: Secondary | ICD-10-CM

## 2014-04-15 DIAGNOSIS — I639 Cerebral infarction, unspecified: Secondary | ICD-10-CM

## 2014-04-15 DIAGNOSIS — I509 Heart failure, unspecified: Secondary | ICD-10-CM

## 2014-04-15 DIAGNOSIS — I6350 Cerebral infarction due to unspecified occlusion or stenosis of unspecified cerebral artery: Principal | ICD-10-CM

## 2014-04-15 DIAGNOSIS — I2510 Atherosclerotic heart disease of native coronary artery without angina pectoris: Secondary | ICD-10-CM

## 2014-04-15 MED ORDER — SCOPOLAMINE 1 MG/3DAYS TD PT72
1.0000 | MEDICATED_PATCH | TRANSDERMAL | Status: DC
  Administered 2014-04-15: 1.5 mg via TRANSDERMAL
  Filled 2014-04-15: qty 1

## 2014-04-15 MED ORDER — LORAZEPAM 2 MG/ML IJ SOLN: 1.0000 mg | INTRAMUSCULAR | Status: DC | PRN

## 2014-04-15 MED ORDER — SCOPOLAMINE 1 MG/3DAYS TD PT72: 1.0000 | MEDICATED_PATCH | TRANSDERMAL | Status: AC

## 2014-04-15 MED ORDER — MORPHINE SULFATE 2 MG/ML IJ SOLN: 1.0000 mg | INTRAMUSCULAR | Status: DC | PRN

## 2014-04-15 MED ORDER — LORAZEPAM 2 MG/ML IJ SOLN: 0.5000 mg | Freq: Three times a day (TID) | INTRAMUSCULAR | Status: DC

## 2014-04-15 MED ORDER — ATROPINE SULFATE 1 % OP SOLN
2.0000 [drp] | OPHTHALMIC | Status: DC | PRN
  Administered 2014-04-15: 3 [drp] via SUBLINGUAL
  Filled 2014-04-15: qty 2

## 2014-04-15 MED ORDER — ATROPINE SULFATE 1 % OP SOLN: 2.0000 [drp] | OPHTHALMIC | Status: AC | PRN

## 2014-04-15 NOTE — Evaluation (Signed)
Clinical/Bedside Swallow Evaluation Patient Details  Name: [name] MRN: [id number] Date of Birth: Jul 06, 1914  Today's Date: 04/15/2014 Time: 6962-9528 SLP Time Calculation (min): 15 min  Past Medical History:  Past Medical History  Diagnosis Date  . Osteoarthritis   . Dyslipidemia   . Carotid stenosis   . Hypertension   . CAD (coronary artery disease)   . Glaucoma   . Hearing loss   . Carcinoma     basal cell  . Cataract   . Anemia   . Macular degeneration   . Other abnormal glucose 2003  . Shortness of breath   . Myocardial infarction   . Diastolic CHF, acute on chronic 06/24/2011   Past Surgical History:  Past Surgical History  Procedure Laterality Date  . Cataract extraction    . Tonsillectomy    . Non-stemi  2001    w/ a successful PTCA  . Stent of rca      unsuccessful PTCA of left circumflex  . Adenoidectomy    . Tonsillectomy    . Eye surgery      cataracts B eyes   HPI:  [name] is a 80 y.o. female with PMH of dementia, depression, bilateral severe hearing loss, incontinence of both bladder and bowel, coronary artery disease, bilateral carotid stenosis, macular degeneration-with significant loss of vision bilaterally, completely dependent on primary caregivers for all daily activities of living, essentially bed bound who presented 8/30 from SNF with decreased level of consciousness.  CT scan of the head showed a large right MCA infarct. Family did not want to commence any stroke workup, did not want any treatment, and wanted just comfort care. Bedside swallow evaluation was ordered to determine least restrictive diet to facilitate with d/c planning.   Assessment / Plan / Recommendation Clinical Impression  Pt's decreased level of alertness significantly impacts her ability to take in any PO. Despite Max multimodal stimulation from SLP, including Total A for oral care via suctioning, pt demonstrated very limited arousal. At baseline, pt was noted to  have mild audible secretions, indicative of decreased airway protection. Aspiration risk is severe, and therefore no PO trials were administered this morning. Given pt's overall prognosis and family wishes for full comfort care, no SLP f/u is recommended at this time. Should GOC change or clinical presentation improve, please reorder SLP for reassessment.     Aspiration Risk  Severe    Diet Recommendation NPO (versus comfort feeds as family wishes)   Medication Administration: Via alternative means Supervision: Staff to assist with self feeding    Other  Recommendations Oral Care Recommendations: Other (Comment) (QID)   Follow Up Recommendations  24 hour supervision/assistance;Other (comment) (hospice, goal is comfort)    Frequency and Duration        Pertinent Vitals/Pain n/a    SLP Swallow Goals     Swallow Study Prior Functional Status       General Date of Onset: 04/14/14 HPI: [name] is a 80 y.o. female with PMH of dementia, depression, bilateral severe hearing loss, incontinence of both bladder and bowel, coronary artery disease, bilateral carotid stenosis, macular degeneration-with significant loss of vision bilaterally, completely dependent on primary caregivers for all daily activities of living, essentially bed bound who presented 8/30 from SNF with decreased level of consciousness.  CT scan of the head showed a large right MCA infarct. Family did not want to commence any stroke workup, did not want any treatment, and wanted just comfort care. Bedside  swallow evaluation was ordered to determine least restrictive diet to facilitate with d/c planning. Type of Study: Bedside swallow evaluation Previous Swallow Assessment: none in chart Diet Prior to this Study: NPO Temperature Spikes Noted: Yes (low grade) Respiratory Status: Room air History of Recent Intubation: No Behavior/Cognition: Lethargic;Doesn't follow directions;Hard of hearing Self-Feeding Abilities: Total  assist Patient Positioning: Upright in bed Baseline Vocal Quality: Other (comment) (unable to assess) Volitional Cough: Cognitively unable to elicit Volitional Swallow: Unable to elicit    Oral/Motor/Sensory Function     Ice Chips Ice chips: Not tested   Thin Liquid Thin Liquid: Not tested    Nectar Thick Nectar Thick Liquid: Not tested   Honey Thick Honey Thick Liquid: Not tested   Puree Puree: Not tested   Solid   GO    Solid: Not tested        Germain Osgood, M.A. CCC-SLP [phone]  Germain Osgood 04/15/2014,11:28 AM

## 2014-04-15 NOTE — Progress Notes (Signed)
Nutrition Brief Note  Chart reviewed. Pt now transitioning to comfort care.  No further nutrition interventions warranted at this time.  Please re-consult as needed.   Davien Malone RD, LDN, CNSC [phone] Pager [phone] After Hours Pager    

## 2014-04-15 NOTE — Progress Notes (Signed)
[facility]                                                                              Patient Demographics  [name], is a 80 y.o. female, DOB - 1914/01/08, ELF:[id number]  Admit date - 04/14/2014   Admitting Physician Evalee Mutton Kristeen Mans, MD  Outpatient Primary MD for the patient is GREEN, Viviann Spare, MD  LOS - 1   Chief Complaint  Patient presents with  . Altered Mental Status      HPI 04/14/2014 [name] is a 80 y.o. female with a Past Medical History of dementia, depression, bilateral severe hearing loss, incontinence of both bladder and bowel, coronary artery disease, bilateral carotid stenosis number macular degeneration-with significant loss of vision bilaterally, completely dependent on primary caregivers for all daily activities of living, essentially bed bound who presented from her skilled nursing facility with decreased level of consciousness. Patient was unable to participate in the history taking process, as is the most of this history wass taken from the ED chart. Apparently, patient was found in a skilled nursing facility with significantly decreased level of consciousness, and transported to the emergency room. She was given Narcan with very minimal improvement in symptoms. In the ED, she was found to have left-sided deficits, and neglect, a CT scan of the head showed a large right MCA infarct. Family-2 sons at bedside, did not want to commence any stroke workup, did not want any treatment (including ASA), and wanted just comfort care. The hospitalist service was then asked to admit this patient for further evaluation and comfort care.   Assessment & Plan   Unfortunate 80 year old female presented with decreased mentation and level of consciousness. Arrival to the ER showed a right MCA infarct on CT of the head. Patient family was at bedside and decided to make patient comfort care only. They did not want any further workup or medical interventions. Continue  scheduled as well as when necessary Ativan, morphine if necessary, scopolamine patch.  Acute CVA Unspecified hypothyroidism Hypertension Hearing loss And diastolic congestive heart failure Carotid artery disease Carotid stenosis Leukocytosis  Code Status: DO NOT RESUSCITATE, comfort care  Family Communication: Daughter at bedside and  Disposition Plan: Admitted, pending decision from family regarding placement  Time Spent in minutes   30 minutes  Procedures  None  Consults   None  DVT Prophylaxis  none  Lab Results  Component Value Date   PLT 336 04/14/2014    Medications  Scheduled Meds: .  stroke: mapping our early stages of recovery book   Does not apply Once  . LORazepam  0.5 mg Intravenous Q8H  . scopolamine  1 patch Transdermal Q72H   Continuous Infusions: . sodium chloride 75 mL/hr at 04/15/14 0803   PRN Meds:.atropine, LORazepam, morphine injection  Antibiotics    Anti-infectives   None      Subjective:   [name] seen and examined today.  Patient currently sleeping and unarousable. Patient's daughter is at bedside.  Objective:   Filed Vitals:   04/14/14 1745 04/14/14 1800 04/14/14 1842 04/15/14 0601  BP: 172/74 184/88 160/58 153/91  Pulse: 95 100 96 132  Temp:   98.4  F (36.9 C) 99.7 F (37.6 C)  TempSrc:   Axillary Axillary  Resp: 22 21 21 24   SpO2: 96% 95% 93% 94%    Wt Readings from Last 3 Encounters:  04/06/13 52.617 kg (116 lb)  06/28/11 52.9 kg (116 lb 10 oz)  02/11/11 52.617 kg (116 lb)     Intake/Output Summary (Last 24 hours) at 04/15/14 1116 Last data filed at 04/15/14 0931  Gross per 24 hour  Intake    245 ml  Output      0 ml  Net    245 ml    Exam  General: Well developed, well nourished, NAD, appears stated age  HEENT: NCAT, PERRLA, EOMI, Anicteic Sclera, mucous membranes moist.   Cardiovascular: S1 S2 auscultated,   Respiratory: Clear to auscultation bilaterally with equal chest rise  Extremities:  warm dry without cyanosis clubbing or edema  Skin: Without rashes exudates or nodules  Data Review   Micro Results No results found for this or any previous visit (from the past 240 hour(s)).  Radiology Reports Ct Head Wo Contrast  04/14/2014   CLINICAL DATA:  80 year old female found down. Gaze deviation. Initial encounter.  EXAM: CT HEAD WITHOUT CONTRAST  TECHNIQUE: Contiguous axial images were obtained from the base of the skull through the vertex without intravenous contrast.  COMPARISON:  Brain MRI and non contrast head CT 12/26/2009  FINDINGS: Visualized paranasal sinuses and mastoids are clear. No acute osseous abnormality identified.  No acute orbit or scalp soft tissue finding identified.  Chronic Calcified atherosclerosis at the skull base. Small area of chronic encephalomalacia in the right superior frontal gyrus is stable since 2011 (series 9, image 22 on MRI at that time). There is superimposed increase generalized cerebral white matter hypodensity.  Furthermore, there is generalized hypodensity in the right MCA territory most apparent on series 201, image 18. Suspicious increased density at the distal right M1 segment and posterior right sylvian division branch (images 13 and 14). Mild if any associated mass effect at this time. No associated hemorrhage.  No other acute cortically based infarct identified. Generalized volume loss since 2011. No ventriculomegaly. Increased brainstem heterogeneity since that time probably due to chronic small vessel disease. No midline shift, intracranial mass effect or discrete mass lesion.  IMPRESSION: 1. Acute/evolving right MCA infarct with evidence of right MCA M1/M2 clot. 2. No associated hemorrhage. No significant mass effect at this time. Study discussed by telephone with Dr. Elnora Morrison on 04/14/2014 at 1558 hrs.   Electronically Signed   By: Lars Pinks M.D.   On: 04/14/2014 16:00    CBC  Recent Labs Lab 04/14/14 1602  WBC 16.6*  HGB 13.1    HCT 41.3  PLT 336  MCV 96.0  MCH 30.5  MCHC 31.7  RDW 13.5  LYMPHSABS 0.6*  MONOABS 0.5  EOSABS 0.0  BASOSABS 0.0    Chemistries   Recent Labs Lab 04/14/14 1602  NA 148*  K 4.0  CL 108  CO2 23  GLUCOSE 193*  BUN 32*  CREATININE 0.99  CALCIUM 9.4  AST 33  ALT 17  ALKPHOS 95  BILITOT 0.5   ------------------------------------------------------------------------------------------------------------------ CrCl is unknown because both a height and weight (above a minimum accepted value) are required for this calculation. ------------------------------------------------------------------------------------------------------------------ No results found for this basename: HGBA1C,  in the last 72 hours ------------------------------------------------------------------------------------------------------------------ No results found for this basename: CHOL, HDL, LDLCALC, TRIG, CHOLHDL, LDLDIRECT,  in the last 72 hours ------------------------------------------------------------------------------------------------------------------ No results found for this basename:  TSH, T4TOTAL, FREET3, T3FREE, THYROIDAB,  in the last 72 hours ------------------------------------------------------------------------------------------------------------------ No results found for this basename: VITAMINB12, FOLATE, FERRITIN, TIBC, IRON, RETICCTPCT,  in the last 72 hours  Coagulation profile No results found for this basename: INR, PROTIME,  in the last 168 hours  No results found for this basename: DDIMER,  in the last 72 hours  Cardiac Enzymes No results found for this basename: CK, CKMB, TROPONINI, MYOGLOBIN,  in the last 168 hours ------------------------------------------------------------------------------------------------------------------ No components found with this basename: POCBNP,     Alesa Echevarria D.O. on 04/15/2014 at 11:16 AM  Between 7am to 7pm - Pager -  [phone]  After 7pm go to [email or web address] - password [email or web address]  And look for the night coverage person covering for me after hours  [facility] Office  [phone]

## 2014-04-15 NOTE — Discharge Instructions (Signed)
Hospice °Hospice is a service that is designed to provide people who are terminally ill and their families with medical, spiritual, and psychological support. Its aim is to improve your quality of life by keeping you as alert and comfortable as possible. Hospice is performed by a team of health care professionals and volunteers who: °· Help keep you comfortable. Hospice can be provided in your home or in a homelike setting. The hospice staff works with your family and friends to help meet your needs. You will enjoy the support of loved ones by receiving much of your basic care from family and friends. °· Provide pain relief and manage your symptoms. The staff supply all necessary medicines and equipment. °· Provide companionship when you are alone. °· Allow you and your family to rest. They may do light housekeeping, prepare meals, and run errands. °· Provide counseling. They will make sure your emotional, spiritual, and social needs and those of your family are being met. °· Provide spiritual care. Spiritual care is individualized to meet your needs and your family's needs. It may involve helping you look at what death means to you, say goodbye, or perform a specific religious ceremony or ritual. °Hospice teams often include: °· A nurse. °· A doctor. °· Social workers. °· Religious leaders (such as a chaplain). °· Trained volunteers. °WHEN SHOULD HOSPICE CARE BEGIN? °Most people who use hospice are believed to have fewer than 6 months to live. Your family and health care providers can help you decide when hospice services should begin. If your condition improves, you may discontinue the program. °WHAT SHOULD I CONSIDER BEFORE SELECTING A PROGRAM? °Most hospice programs are run by nonprofit, independent organizations. Some are affiliated with hospitals, nursing homes, or home health care agencies. Hospice programs can take place in the home or at a hospice center, hospital, or skilled nursing facility. When choosing  a hospice program, ask the following questions: °· What services are available to me? °· What services are offered to my loved ones? °· How involved are my loved ones? °· How involved is my health care provider? °· Who makes up the hospice care team? How are they trained or screened? °· How will my pain and symptoms be managed? °· If my circumstances change, can the services be provided in a different setting, such as my home or in the hospital? °· Is the program reviewed and licensed by the state or certified in some other way? °WHERE CAN I LEARN MORE ABOUT HOSPICE? °You can learn about existing hospice programs in your area from your health care providers. You can also read more about hospice online. The websites of the following organizations contain helpful information: °· The National Hospice and Palliative Care Organization (NHPCO). °· The Hospice Association of America (HAA). °· The Hospice Education Institute. °· The American Cancer Society (ACS). °· Hospice Net. °Document Released: 11/19/2003 Document Revised: 08/07/2013 Document Reviewed: 06/12/2013 °ExitCare® Patient Information ©2015 [employer]. This information is not intended to replace advice given to you by your health care provider. Make sure you discuss any questions you have with your health care provider. ° °

## 2014-04-15 NOTE — Progress Notes (Signed)
Patient ID: [name], female   DOB: 02-28-1914, 80 y.o.   MRN: [id number]    Patient ID: [name], female   DOB: 08-Jan-1914, 80 y.o.   MRN: [id number]  Code Status: DNR  Allergies  Allergen Reactions  . Aspirin Nausea Only  . Atorvastatin Nausea Only  . Benazepril     Not known  . Clopidogrel Bisulfate     Unknown rxn  . Flexeril [Cyclobenzaprine Hcl]   . Guanfacine     Not known.  Marland Kitchen Hctz [Hydrochlorothiazide]     Not known  . Nitroglycerin     Unknown rxn  . Ramipril     REACTION: cough    Chief Complaint  Patient presents with  . Medical Management of Chronic Issues  . Hospitalization Follow-up    HPI: Patient is a 80 y.o. female seen in the SNF at [facility] today for evaluation of end of life care.    Hospitalized 04/14/2014-04/15/2014 for Acute CVA-re admitted to SNF for end of life. The patient presented to ED with decreased level of consciousness. She was found to have left-sided deficits, and neglect, a CT scan of the head showed a large right MCA infarct. Family-2 sons at bedside, did not want to commence any stroke workup, did not want any treatment (including ASA), and wanted just comfort care. Ativan, morphine if necessary, scopolamine patch.     Problem List Items Addressed This Visit   Acute ischemic stroke - Primary     Hospitalized 04/14/2014-04/15/2014 for Acute CVA-re admitted to SNF for end of life. The patient presented to ED with decreased level of consciousness. She was found to have left-sided deficits, and neglect, a CT scan of the head showed a large right MCA infarct. Family-2 sons at bedside, did not want to commence any stroke workup, did not want any treatment (including ASA), and wanted just comfort care. Ativan, morphine if necessary, scopolamine patch.            Review of Systems:  Review of Systems  Constitutional: Positive for fever. Negative for chills. Malaise/fatigue: chronic.       Unresponsive.   HENT: Positive for  congestion. Hearing loss: severe.        Central congestion  Eyes: Negative for pain and discharge.       UTA  Respiratory: Negative for cough.        Central congestion.   Cardiovascular: PND: chronic.       UTA  Gastrointestinal: Negative for nausea, vomiting and diarrhea.  Genitourinary: Frequency: incontinent of bladder.       Incontinent.   Musculoskeletal: Positive for myalgias (chronic). Back pain: chronic.  Skin: Negative for itching and rash.  Neurological: Weakness: generalized.       Unresponsive.   Psychiatric/Behavioral: Depression: flat affect.       UTA     Past Medical History  Diagnosis Date  . Osteoarthritis   . Dyslipidemia   . Carotid stenosis   . Hypertension   . CAD (coronary artery disease)   . Glaucoma   . Hearing loss   . Carcinoma     basal cell  . Cataract   . Anemia   . Macular degeneration   . Other abnormal glucose 2003  . Shortness of breath   . Myocardial infarction   . Diastolic CHF, acute on chronic 06/24/2011   Past Surgical History  Procedure Laterality Date  . Cataract extraction    . Tonsillectomy    .  Non-stemi  2001    w/ a successful PTCA  . Stent of rca      unsuccessful PTCA of left circumflex  . Adenoidectomy    . Tonsillectomy    . Eye surgery      cataracts B eyes   Social History:   reports that she has never smoked. She does not have any smokeless tobacco history on file. She reports that she does not drink alcohol or use illicit drugs.  Family History  Problem Relation Age of Onset  . Coronary artery disease    . Breast cancer Daughter   . Stroke Mother   . Heart attack Father     Medications: Patient's Medications  New Prescriptions   No medications on file  Previous Medications   ATROPINE 1 % OPHTHALMIC SOLUTION    Place 2-4 drops under the tongue every 4 (four) hours as needed (secretions).   LORAZEPAM (ATIVAN) 1 MG TABLET    Take 1 mg by mouth every 2 (two) hours as needed for anxiety.   MORPHINE  (ROXANOL) 20 MG/ML CONCENTRATED SOLUTION    Take 5 mg by mouth every 2 (two) hours as needed for severe pain.   SCOPOLAMINE (TRANSDERM-SCOP) 1 MG/3DAYS    Place 1 patch (1.5 mg total) onto the skin every 3 (three) days.  Modified Medications   No medications on file  Discontinued Medications   LORAZEPAM (ATIVAN) 2 MG/ML INJECTION    Inject 0.25 mLs (0.5 mg total) into the vein every 8 (eight) hours.   LORAZEPAM (ATIVAN) 2 MG/ML INJECTION    Inject 0.5 mLs (1 mg total) into the vein every 4 (four) hours as needed for anxiety, seizure or sedation.   MORPHINE 2 MG/ML INJECTION    Inject 0.5-1 mLs (1-2 mg total) into the vein every 2 (two) hours as needed.     Physical Exam: Physical Exam  Constitutional: She appears well-developed and well-nourished.  unresponsive  HENT:  Head: Atraumatic.     Eyes:  Pupil equal, round, slow reaction to light.   Neck: Normal range of motion. Neck supple. No JVD present. No thyromegaly present.  Cardiovascular: Normal rate and regular rhythm.   Murmur heard.  Systolic murmur is present with a grade of 2/6  Pulmonary/Chest: Effort normal. She has no wheezes. She has rales in the left lower field.  Abdominal: Soft. Bowel sounds are normal.  Musculoskeletal:  UTA  Lymphadenopathy:    She has no cervical adenopathy.  Neurological:  UTA  Skin: Skin is warm and dry. No rash noted.  Psychiatric:  UTA     There were no vitals filed for this visit. Labs reviewed: Basic Metabolic Panel:  Recent Labs  05/31/13 08/21/13 09/25/13 02/05/14 04/14/14 1602  NA 140 141  --  138 148*  K 3.8 4.6  --  4.4 4.0  CL  --   --   --   --  108  CO2  --   --   --   --  23  GLUCOSE  --   --   --   --  193*  BUN 20 18  --  23* 32*  CREATININE 0.8 0.8  --  1.0 0.99  CALCIUM  --   --   --   --  9.4  TSH 5.93*  --  3.32  --   --    CBC:  Recent Labs  08/21/13 02/05/14 04/14/14 1602  WBC 9.9 8.8 16.6*  NEUTROABS  --   --  15.5*  HGB 11.2* 12.4 13.1  HCT 34*  38 41.3  MCV  --   --  96.0  PLT 368 326 336    Past Procedures:  10/17/12 CXR CHF and right effusion.   04/14/14 CT w/o contrast:  IMPRESSION:  1. Acute/evolving right MCA infarct with evidence of right MCA M1/M2  clot.  2. No associated hemorrhage. No significant mass effect at this  time.  Assessment/Plan Acute ischemic stroke Hospitalized 04/14/2014-04/15/2014 for Acute CVA-re admitted to SNF for end of life. The patient presented to ED with decreased level of consciousness. She was found to have left-sided deficits, and neglect, a CT scan of the head showed a large right MCA infarct. Family-2 sons at bedside, did not want to commence any stroke workup, did not want any treatment (including ASA), and wanted just comfort care. Ativan, morphine if necessary, scopolamine patch.         Family/ Staff Communication: comfort measures. Hospice referral.   Goals of Care: SNF  Labs/tests ordered: none

## 2014-04-15 NOTE — Discharge Summary (Signed)
Physician Discharge Summary  [name] QMV:[id number] DOB: 02/14/1914 DOA: 04/14/2014  PCP: Estill Dooms, MD  Admit date: 04/14/2014 Discharge date: 04/15/2014  Time spent: 45 minutes  Recommendations for Outpatient Follow-up:  Patient will be discharged back to friend's home, and made comfort care. Referral to [facility] should be conducted once patient does arrive nursing facility.  Patient should continue medications as prescribed.  Discharge Diagnoses:  Acute CVA  Unspecified hypothyroidism  Hypertension  Hearing loss  And diastolic congestive heart failure  Carotid artery disease  Carotid stenosis  Leukocytosis  Discharge Condition: stable  Diet recommendation: NPO  There were no vitals filed for this visit.  History of present illness:  04/14/2014  [name] is a 80 y.o. female with a Past Medical History of dementia, depression, bilateral severe hearing loss, incontinence of both bladder and bowel, coronary artery disease, bilateral carotid stenosis number macular degeneration-with significant loss of vision bilaterally, completely dependent on primary caregivers for all daily activities of living, essentially bed bound who presented from her skilled nursing facility with decreased level of consciousness. Patient was unable to participate in the history taking process, as is the most of this history wass taken from the ED chart. Apparently, patient was found in a skilled nursing facility with significantly decreased level of consciousness, and transported to the emergency room. She was given Narcan with very minimal improvement in symptoms. In the ED, she was found to have left-sided deficits, and neglect, a CT scan of the head showed a large right MCA infarct. Family-2 sons at bedside, did not want to commence any stroke workup, did not want any treatment (including ASA), and wanted just comfort care. The hospitalist service was then asked to admit this patient  for further evaluation and comfort care.  Hospital Course:  Unfortunate 80 year old female presented with decreased mentation and level of consciousness. Arrival to the ER showed a right MCA infarct on CT of the head. Patient family was at bedside and decided to make patient comfort care only. They did not want any further workup or medical interventions. Continue scheduled as well as when necessary Ativan, morphine if necessary, scopolamine patch.   Acute CVA  Unspecified hypothyroidism  Hypertension  Hearing loss  And diastolic congestive heart failure  Carotid artery disease  Carotid stenosis  Leukocytosis   Procedures: None  Consultations: Palliative care, Dr. Billey Chang  Discharge Exam: Filed Vitals:   04/15/14 0601  BP: 153/91  Pulse: 132  Temp: 99.7 F (37.6 C)  Resp: 24   Exam  General: Well developed, well nourished, NAD, appears stated age  HEENT: NCAT, PERRLA, EOMI, Anicteic Sclera, mucous membranes moist.  Cardiovascular: S1 S2 auscultated,  Respiratory: Clear to auscultation bilaterally with equal chest rise  Extremities: warm dry without cyanosis clubbing or edema  Skin: Without rashes exudates or nodules  Discharge Instructions      Discharge Instructions   Discharge instructions    Complete by:  As directed   Patient will be discharged back to friend's home, and made comfort care. Referral to [facility] should be conducted once patient does arrive nursing facility.  Patient should continue medications as prescribed.            Medication List    STOP taking these medications       brinzolamide 1 % ophthalmic suspension  Commonly known as:  AZOPT     CERTAVITE/ANTIOXIDANTS PO     docusate sodium 100 MG capsule  Commonly known as:  COLACE     DULoxetine 30 MG capsule  Commonly known as:  CYMBALTA     furosemide 20 MG tablet  Commonly known as:  LASIX     HYDROcodone-acetaminophen 5-325 MG per tablet  Commonly known as:   NORCO/VICODIN     latanoprost 0.005 % ophthalmic solution  Commonly known as:  XALATAN     levothyroxine 50 MCG tablet  Commonly known as:  SYNTHROID, LEVOTHROID     losartan 100 MG tablet  Commonly known as:  COZAAR     metoprolol 50 MG tablet  Commonly known as:  LOPRESSOR     RESOURCE 2.0 PO      TAKE these medications       atropine 1 % ophthalmic solution  Place 2-4 drops under the tongue every 4 (four) hours as needed (secretions).     LORazepam 2 MG/ML injection  Commonly known as:  ATIVAN  Inject 0.25 mLs (0.5 mg total) into the vein every 8 (eight) hours.     LORazepam 2 MG/ML injection  Commonly known as:  ATIVAN  Inject 0.5 mLs (1 mg total) into the vein every 4 (four) hours as needed for anxiety, seizure or sedation.     morphine 2 MG/ML injection  Inject 0.5-1 mLs (1-2 mg total) into the vein every 2 (two) hours as needed.     scopolamine 1 MG/3DAYS  Commonly known as:  TRANSDERM-SCOP  Place 1 patch (1.5 mg total) onto the skin every 3 (three) days.       Allergies  Allergen Reactions  . Aspirin Nausea Only  . Atorvastatin Nausea Only  . Benazepril     Not known  . Clopidogrel Bisulfate     Unknown rxn  . Flexeril [Cyclobenzaprine Hcl]   . Guanfacine     Not known.  Marland Kitchen Hctz [Hydrochlorothiazide]     Not known  . Nitroglycerin     Unknown rxn  . Ramipril     REACTION: cough   Follow-up Information   Follow up with GREEN, Viviann Spare, MD. Schedule an appointment as soon as possible for a visit in 1 week. (As needed)    Specialty:  Internal Medicine   Contact information:   [address] [place] [address] [phone]        The results of significant diagnostics from this hospitalization (including imaging, microbiology, ancillary and laboratory) are listed below for reference.    Significant Diagnostic Studies: Ct Head Wo Contrast  04/14/2014   CLINICAL DATA:  80 year old female found down. Gaze deviation. Initial encounter.   EXAM: CT HEAD WITHOUT CONTRAST  TECHNIQUE: Contiguous axial images were obtained from the base of the skull through the vertex without intravenous contrast.  COMPARISON:  Brain MRI and non contrast head CT 12/26/2009  FINDINGS: Visualized paranasal sinuses and mastoids are clear. No acute osseous abnormality identified.  No acute orbit or scalp soft tissue finding identified.  Chronic Calcified atherosclerosis at the skull base. Small area of chronic encephalomalacia in the right superior frontal gyrus is stable since 2011 (series 9, image 22 on MRI at that time). There is superimposed increase generalized cerebral white matter hypodensity.  Furthermore, there is generalized hypodensity in the right MCA territory most apparent on series 201, image 18. Suspicious increased density at the distal right M1 segment and posterior right sylvian division branch (images 13 and 14). Mild if any associated mass effect at this time. No associated hemorrhage.  No other acute cortically based infarct identified. Generalized volume  loss since 2011. No ventriculomegaly. Increased brainstem heterogeneity since that time probably due to chronic small vessel disease. No midline shift, intracranial mass effect or discrete mass lesion.  IMPRESSION: 1. Acute/evolving right MCA infarct with evidence of right MCA M1/M2 clot. 2. No associated hemorrhage. No significant mass effect at this time. Study discussed by telephone with Dr. Elnora Morrison on 04/14/2014 at 1558 hrs.   Electronically Signed   By: Lars Pinks M.D.   On: 04/14/2014 16:00    Microbiology: No results found for this or any previous visit (from the past 240 hour(s)).   Labs: Basic Metabolic Panel:  Recent Labs Lab 04/14/14 1602  NA 148*  K 4.0  CL 108  CO2 23  GLUCOSE 193*  BUN 32*  CREATININE 0.99  CALCIUM 9.4   Liver Function Tests:  Recent Labs Lab 04/14/14 1602  AST 33  ALT 17  ALKPHOS 95  BILITOT 0.5  PROT 7.9  ALBUMIN 3.3*   No results  found for this basename: LIPASE, AMYLASE,  in the last 168 hours No results found for this basename: AMMONIA,  in the last 168 hours CBC:  Recent Labs Lab 04/14/14 1602  WBC 16.6*  NEUTROABS 15.5*  HGB 13.1  HCT 41.3  MCV 96.0  PLT 336   Cardiac Enzymes: No results found for this basename: CKTOTAL, CKMB, CKMBINDEX, TROPONINI,  in the last 168 hours BNP: BNP (last 3 results) No results found for this basename: PROBNP,  in the last 8760 hours CBG: No results found for this basename: GLUCAP,  in the last 168 hours     Signed:  Cristal Ford  [facility] 04/15/2014, 12:51 PM

## 2014-04-15 NOTE — Consult Note (Signed)
Patient [name]      DOB: 26-Apr-1914      EXB:[id number]     Consult Note from the Palliative Medicine Team at [place] Requested by: Dr. Ree Kida     PCP: Estill Dooms, MD Reason for Consultation: [place]    Phone Number:[phone]  Assessment of patients Current state: 80 yr old white female with a known past medical history for dementia, depression, hearing loss, carotid disease, an macular degeneration. Was admitted with decreased baseline mentation.  She was found to have a large right sided MCA stroke. The patient's son Lovena Le is her legal surrogate, he works with his Brother Linna Hoff and sister Santiago Glad in the best interests of their mother.  All agree that their mother would not desire life sustaining treatments including CPR,life support machines, or feeding tubes.  Lovena Le reports her cognitive status has been declining for some time and she has not been able to eat reliably. All agree that full comfort care with return to [facility] would be best for all.  Goals of Care: 1.  Code Status: DNR   2. Scope of Treatment: Family desires to focus on symptom management.  At this time, Fenna is comfortable.  She will benefit by having hospice assist with symptom management which may take the form of treating pain, or dyspnea at the end of life.  4. Disposition: Return to [facility] with Hospice care   3. Symptom Management:   1. Anxiety/Agitation: not an issue at this time but can use ativan 0.5 mg q 4 prn if needed. 2. Pain: Patient was taking hydrocodone at home prn can use Roxanol 20 mg/ml 5 mg SL q 2 hrs prn dispense 30 ml . 3. Bowel Regimen: monitor 4. Terminal Secretions: can use atropine 1% opthalmic solution under the tongue 4 drops q 4 hours prn for secretions. A scopolamine patch can also be added for comfort.  4. Psychosocial: Family describes their mom as a proper lady, introvert, with a wicked sense of humor.  She was educated through the 7 th grade at  her father's one room school house . She then educated herself through books and did not go back to get her GED until she was an adult in her 41's.  She had "impeccable grammar skills and could spell better than anyone.   5. Spiritual: woman of devout faith, son has informed her church.        Patient Documents Completed or Given: Document Given Completed  Advanced Directives Pkt    MOST    DNR    Gone from My [facility]    Hard Choices      Brief HPI: 80 yr old white female with history dementia who has been declining for sometime now. Patient had been previously enrolled in hospice care but graduated when she plateaued a year ago.  Family familiar with stroke progression .  We were asked to assist with goals of care.   ROS: unable.    PMH:  Past Medical History  Diagnosis Date  . Osteoarthritis   . Dyslipidemia   . Carotid stenosis   . Hypertension   . CAD (coronary artery disease)   . Glaucoma   . Hearing loss   . Carcinoma     basal cell  . Cataract   . Anemia   . Macular degeneration   . Other abnormal glucose 2003  . Shortness of breath   . Myocardial infarction   . Diastolic CHF,  acute on chronic 06/24/2011     PSH: Past Surgical History  Procedure Laterality Date  . Cataract extraction    . Tonsillectomy    . Non-stemi  2001    w/ a successful PTCA  . Stent of rca      unsuccessful PTCA of left circumflex  . Adenoidectomy    . Tonsillectomy    . Eye surgery      cataracts B eyes   I have reviewed the FH and SH and  If appropriate update it with new information. Allergies  Allergen Reactions  . Aspirin Nausea Only  . Atorvastatin Nausea Only  . Benazepril     Not known  . Clopidogrel Bisulfate     Unknown rxn  . Flexeril [Cyclobenzaprine Hcl]   . Guanfacine     Not known.  Marland Kitchen Hctz [Hydrochlorothiazide]     Not known  . Nitroglycerin     Unknown rxn  . Ramipril     REACTION: cough   Scheduled Meds: .  stroke: mapping our early stages  of recovery book   Does not apply Once  . LORazepam  0.5 mg Intravenous Q8H  . scopolamine  1 patch Transdermal Q72H   Continuous Infusions: . sodium chloride 75 mL/hr at 04/15/14 0803   PRN Meds:.atropine, LORazepam, morphine injection    BP 153/91  Pulse 132  Temp(Src) 99.7 F (37.6 C) (Axillary)  Resp 24  SpO2 94%   PPS: 10%   Intake/Output Summary (Last 24 hours) at 04/15/14 1158 Last data filed at 04/15/14 0931  Gross per 24 hour  Intake    245 ml  Output      0 ml  Net    245 ml   LBM:PTA  Physical Exam:  General: Does not open eyes to tactile or verbal stimuli, but will withdraw from perceived noxious stimuli HEENT:  Pupils not examined,mmdry Chest:   Decreased anteriorly with some basilar crackles CVS: tachy, S1, S2, distant, no murmur Abdomen:soft, no grimace on palpation Ext: thin, not moving left upper extremity but pulled away from left lower side and right side Neuro:not responsive in a purposeful way.  Labs: CBC    Component Value Date/Time   WBC 16.6* 04/14/2014 1602   WBC 8.8 02/05/2014   RBC 4.30 04/14/2014 1602   RBC 3.80* 06/24/2011 1400   HGB 13.1 04/14/2014 1602   HCT 41.3 04/14/2014 1602   PLT 336 04/14/2014 1602   MCV 96.0 04/14/2014 1602   MCH 30.5 04/14/2014 1602   MCHC 31.7 04/14/2014 1602   RDW 13.5 04/14/2014 1602   LYMPHSABS 0.6* 04/14/2014 1602   MONOABS 0.5 04/14/2014 1602   EOSABS 0.0 04/14/2014 1602   BASOSABS 0.0 04/14/2014 1602      CMP     Component Value Date/Time   NA 148* 04/14/2014 1602   NA 138 02/05/2014   K 4.0 04/14/2014 1602   CL 108 04/14/2014 1602   CO2 23 04/14/2014 1602   GLUCOSE 193* 04/14/2014 1602   BUN 32* 04/14/2014 1602   BUN 23* 02/05/2014   CREATININE 0.99 04/14/2014 1602   CREATININE 1.0 02/05/2014   CALCIUM 9.4 04/14/2014 1602   PROT 7.9 04/14/2014 1602   ALBUMIN 3.3* 04/14/2014 1602   AST 33 04/14/2014 1602   ALT 17 04/14/2014 1602   ALKPHOS 95 04/14/2014 1602   BILITOT 0.5 04/14/2014 1602   GFRNONAA 46*  04/14/2014 1602   GFRAA 54* 04/14/2014 1602    Chest Xray Reviewed/Impressions:CHF. Unchanged from previous  exam.    CT scan of the Head Reviewed/Impressions:  1. Acute/evolving right MCA infarct with evidence of right MCA M1/M2  clot.  2. No associated hemorrhage. No significant mass effect at this  time.  Study discussed by telephone with Dr. Elnora Morrison on 04/14/2014 at  1558 hrs.    Discussed with SW and TRH  Time In Time Out Total Time Spent with Patient Total Overall Time  1100/ 800 AM 1125/815 AM 15 MIN 40 MIN    Greater than 50%  of this time was spent counseling and coordinating care related to the above assessment and plan.  Shaila Gilchrest L. Lovena Le, MD MBA The Palliative Medicine Team at [facility] Phone: [phone] Pager: [phone] ( Use team phone after hours)

## 2014-04-15 NOTE — Progress Notes (Signed)
Patient to return to [facility] today. Plans confirmed with patient and family who are in agreement. SNF is prepared for patient and we will  plan transfer via  [facility]. Pt will be followed by Hospice and [facility].              Charlene Brooke, MSW Clinical Social Worker [phone]

## 2014-04-15 NOTE — Progress Notes (Signed)
Report called to [facility] at [facility]. Patient is in route with ambulance to  Facility.

## 2014-04-16 NOTE — Assessment & Plan Note (Signed)
Hospitalized 04/14/2014-04/15/2014 for Acute CVA-re admitted to SNF for end of life. The patient presented to ED with decreased level of consciousness. She was found to have left-sided deficits, and neglect, a CT scan of the head showed a large right MCA infarct. Family-2 sons at bedside, did not want to commence any stroke workup, did not want any treatment (including ASA), and wanted just comfort care. Ativan, morphine if necessary, scopolamine patch.

## 2014-05-16 DEATH — deceased

## 2015-06-26 NOTE — Progress Notes (Signed)
This encounter was created in error - please disregard.
